# Patient Record
Sex: Female | Born: 1960 | Race: White | Hispanic: No | Marital: Married | Smoking: Current every day smoker
Health system: Southern US, Community
[De-identification: ages and names within clinical notes are randomized; demographics above are authoritative.]

## PROBLEM LIST (undated history)

## (undated) DIAGNOSIS — K649 Unspecified hemorrhoids: Secondary | ICD-10-CM

## (undated) DIAGNOSIS — R911 Solitary pulmonary nodule: Secondary | ICD-10-CM

## (undated) DIAGNOSIS — D689 Coagulation defect, unspecified: Secondary | ICD-10-CM

## (undated) DIAGNOSIS — T8859XA Other complications of anesthesia, initial encounter: Secondary | ICD-10-CM

## (undated) DIAGNOSIS — Q211 Atrial septal defect: Secondary | ICD-10-CM

## (undated) DIAGNOSIS — T4145XA Adverse effect of unspecified anesthetic, initial encounter: Secondary | ICD-10-CM

## (undated) DIAGNOSIS — R05 Cough: Secondary | ICD-10-CM

## (undated) DIAGNOSIS — R059 Cough, unspecified: Secondary | ICD-10-CM

## (undated) DIAGNOSIS — I82409 Acute embolism and thrombosis of unspecified deep veins of unspecified lower extremity: Secondary | ICD-10-CM

## (undated) DIAGNOSIS — I729 Aneurysm of unspecified site: Secondary | ICD-10-CM

## (undated) DIAGNOSIS — I253 Aneurysm of heart: Secondary | ICD-10-CM

## (undated) DIAGNOSIS — K219 Gastro-esophageal reflux disease without esophagitis: Secondary | ICD-10-CM

## (undated) DIAGNOSIS — I10 Essential (primary) hypertension: Secondary | ICD-10-CM

## (undated) DIAGNOSIS — Q2112 Patent foramen ovale: Secondary | ICD-10-CM

## (undated) DIAGNOSIS — D649 Anemia, unspecified: Secondary | ICD-10-CM

## (undated) DIAGNOSIS — F419 Anxiety disorder, unspecified: Secondary | ICD-10-CM

## (undated) DIAGNOSIS — I739 Peripheral vascular disease, unspecified: Secondary | ICD-10-CM

## (undated) HISTORY — DX: Peripheral vascular disease, unspecified: I73.9

## (undated) HISTORY — DX: Aneurysm of unspecified site: I72.9

## (undated) HISTORY — DX: Anxiety disorder, unspecified: F41.9

## (undated) HISTORY — DX: Gastro-esophageal reflux disease without esophagitis: K21.9

## (undated) HISTORY — DX: Acute embolism and thrombosis of unspecified deep veins of unspecified lower extremity: I82.409

## (undated) HISTORY — PX: WISDOM TOOTH EXTRACTION: SHX21

## (undated) HISTORY — PX: TONSILLECTOMY: SUR1361

## (undated) HISTORY — DX: Essential (primary) hypertension: I10

## (undated) HISTORY — PX: BREAST SURGERY: SHX581

## (undated) HISTORY — PX: COSMETIC SURGERY: SHX468

## (undated) HISTORY — DX: Anemia, unspecified: D64.9

## (undated) HISTORY — DX: Unspecified hemorrhoids: K64.9

## (undated) HISTORY — PX: OTHER SURGICAL HISTORY: SHX169

## (undated) HISTORY — DX: Coagulation defect, unspecified: D68.9

---
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
## 2004-07-01 HISTORY — PX: COLONOSCOPY: SHX174

## 2013-02-02 ENCOUNTER — Ambulatory Visit: Payer: Self-pay | Admitting: Obstetrics

## 2013-02-10 ENCOUNTER — Encounter: Payer: Self-pay | Admitting: Obstetrics

## 2013-02-10 ENCOUNTER — Ambulatory Visit (INDEPENDENT_AMBULATORY_CARE_PROVIDER_SITE_OTHER): Payer: BC Managed Care – PPO | Admitting: Obstetrics

## 2013-02-10 VITALS — BP 151/97 | HR 90 | Temp 98.9°F | Ht 60.0 in | Wt 137.2 lb

## 2013-02-10 DIAGNOSIS — Z0000 Encounter for general adult medical examination without abnormal findings: Secondary | ICD-10-CM

## 2013-02-10 DIAGNOSIS — Z113 Encounter for screening for infections with a predominantly sexual mode of transmission: Secondary | ICD-10-CM

## 2013-02-10 DIAGNOSIS — F329 Major depressive disorder, single episode, unspecified: Secondary | ICD-10-CM

## 2013-02-10 DIAGNOSIS — Z01419 Encounter for gynecological examination (general) (routine) without abnormal findings: Secondary | ICD-10-CM

## 2013-02-10 DIAGNOSIS — F3289 Other specified depressive episodes: Secondary | ICD-10-CM

## 2013-02-10 DIAGNOSIS — Z1239 Encounter for other screening for malignant neoplasm of breast: Secondary | ICD-10-CM

## 2013-02-10 DIAGNOSIS — I10 Essential (primary) hypertension: Secondary | ICD-10-CM | POA: Insufficient documentation

## 2013-02-10 MED ORDER — ESCITALOPRAM OXALATE 10 MG PO TABS: 10.0000 mg | ORAL_TABLET | Freq: Every day | ORAL | Status: DC

## 2013-02-10 MED ORDER — LISINOPRIL-HYDROCHLOROTHIAZIDE 10-12.5 MG PO TABS: 1.0000 | ORAL_TABLET | Freq: Every day | ORAL | Status: DC

## 2013-02-10 NOTE — Progress Notes (Signed)
.   Subjective:     Patricia [name] is a 52 y.o. female here for a routine exam.  No current complaints.  Personal health questionnaire reviewed: yes.   Gynecologic History No LMP recorded. Patient is postmenopausal. Contraception: none Last Pap: five years ago. Results were: normal Last mammogram: six years ago. Results were: normal  Obstetric History OB History  No data available     The following portions of the patient's history were reviewed and updated as appropriate: allergies, current medications, past family history, past medical history, past social history, past surgical history and problem list.  Review of Systems Pertinent items are noted in HPI.    Objective:    General appearance: alert and no distress Breasts: normal appearance, no masses or tenderness Abdomen: normal findings: soft, non-tender Pelvic: cervix normal in appearance, external genitalia normal, no adnexal masses or tenderness, no cervical motion tenderness, uterus normal size, shape, and consistency and vagina normal without discharge Extremities: extremities normal, atraumatic, no cyanosis or edema    Assessment:    Healthy female exam.  Depression HTN H/O DVT H/O Cardiac Dz   Plan:    Mammogram ordered. Follow up in: 1 year. Lexapro Rx Prinivil Rx Referred to PMD for general health maintenance.

## 2013-02-11 ENCOUNTER — Telehealth: Payer: Self-pay | Admitting: *Deleted

## 2013-02-11 ENCOUNTER — Other Ambulatory Visit: Payer: Self-pay | Admitting: *Deleted

## 2013-02-11 ENCOUNTER — Encounter: Payer: Self-pay | Admitting: Obstetrics

## 2013-02-11 DIAGNOSIS — N760 Acute vaginitis: Secondary | ICD-10-CM

## 2013-02-11 DIAGNOSIS — B9689 Other specified bacterial agents as the cause of diseases classified elsewhere: Secondary | ICD-10-CM

## 2013-02-11 DIAGNOSIS — I10 Essential (primary) hypertension: Secondary | ICD-10-CM

## 2013-02-11 LAB — PAP IG W/ RFLX HPV ASCU

## 2013-02-11 MED ORDER — LISINOPRIL-HYDROCHLOROTHIAZIDE 10-12.5 MG PO TABS: 1.0000 | ORAL_TABLET | Freq: Every day | ORAL | Status: DC

## 2013-02-11 MED ORDER — METRONIDAZOLE 500 MG PO TABS: 500.0000 mg | ORAL_TABLET | Freq: Two times a day (BID) | ORAL | Status: DC

## 2013-02-11 NOTE — Telephone Encounter (Signed)
Per Michele Mcalpine at [facility] both prescriptions (Flagyl and Zestoretic) have been received and are being worked on. Per Dr. Frances Maywood not write prescription for sleeping medication, pt will need to follow up with PCP for that prescription. Pt expressed understanding. Per Dr. Clearance Coots- okay to change Zestoretic 10-12.5 mg to 10-25 mg and from quantity #1 to #30 with 11 refills.

## 2013-02-11 NOTE — Progress Notes (Signed)
Pt aware prescription sent to pharmacy °

## 2013-02-11 NOTE — Telephone Encounter (Signed)
Per Patricia [name] with [facility], the previous statement made by his Tech stating Zestoretic available in 10-25 mg is incorrect and will fill original prescription of 10-12.5 mg #30 with 11 refills.

## 2013-02-17 ENCOUNTER — Ambulatory Visit (HOSPITAL_COMMUNITY)
Admission: RE | Admit: 2013-02-17 | Discharge: 2013-02-17 | Disposition: A | Discharge status: Home / Self Care | Payer: BC Managed Care – PPO | Source: Ambulatory Visit | Attending: Obstetrics | Admitting: Obstetrics

## 2013-02-17 DIAGNOSIS — Z1231 Encounter for screening mammogram for malignant neoplasm of breast: Secondary | ICD-10-CM | POA: Insufficient documentation

## 2013-02-17 DIAGNOSIS — Z1239 Encounter for other screening for malignant neoplasm of breast: Secondary | ICD-10-CM

## 2013-03-09 ENCOUNTER — Encounter: Payer: Self-pay | Admitting: Gastroenterology

## 2013-03-10 ENCOUNTER — Encounter (INDEPENDENT_AMBULATORY_CARE_PROVIDER_SITE_OTHER): Payer: BC Managed Care – PPO | Admitting: Vascular Surgery

## 2013-03-10 ENCOUNTER — Encounter: Payer: Self-pay | Admitting: Internal Medicine

## 2013-03-10 DIAGNOSIS — M79609 Pain in unspecified limb: Secondary | ICD-10-CM

## 2013-03-10 DIAGNOSIS — I739 Peripheral vascular disease, unspecified: Secondary | ICD-10-CM

## 2013-03-22 ENCOUNTER — Ambulatory Visit (AMBULATORY_SURGERY_CENTER): Payer: Self-pay | Admitting: *Deleted

## 2013-03-22 VITALS — Ht 60.0 in | Wt 136.6 lb

## 2013-03-22 DIAGNOSIS — Z86010 Personal history of colonic polyps: Secondary | ICD-10-CM

## 2013-03-22 MED ORDER — MOVIPREP 100 G PO SOLR: 1.0000 | Freq: Once | ORAL | Status: DC

## 2013-03-22 NOTE — Progress Notes (Signed)
No egg or soy allergy. No anesthesia problems.  Pt states she had colonoscopy in 2006 which was her 4th colonoscopy, 1st colon pt states she had 19 polyps, and had to go back for 1 year follow-up, 2nd colon pt states she had 3 polyps and had to go back for 1 year follow-up, 3rd colon was normal per pt, 4th was normal as well per pt, pt could not remember Dr.'s full name only that it was Dr. Yetta Flock in [place], [place], tried to look up dr while pt was here but she could not identify Dr.   Rock Nephew signed release of information formed and filled out, just waiting on pt to call back with Dr. Rebecca Eaton name and information.-adm

## 2013-03-23 ENCOUNTER — Encounter: Payer: Self-pay | Admitting: Gastroenterology

## 2013-03-24 ENCOUNTER — Telehealth: Payer: Self-pay | Admitting: Gastroenterology

## 2013-03-24 NOTE — Telephone Encounter (Signed)
Release sent

## 2013-03-24 NOTE — Telephone Encounter (Signed)
Pt is calling to give the information of her previous Gi in [place]. His name is Dr. Lacretia Nicks. Francie Massing he is with [facility]. She says his address used to be [address] [address] and phone # [phone]. Per pt address and phone might be different now.

## 2013-03-24 NOTE — Telephone Encounter (Signed)
Pt states she had colonoscopy in 2006 which was her 4th colonoscopy, 1st colon pt states she had 19 polyps, and had to go back for 1 year follow-up, 2nd colon pt states she had 3 polyps and had to go back for 1 year follow-up, 3rd colon was normal per pt, 4th was normal as well per pt, pt could not remember Dr.'s full name only that it was Dr. Yetta Flock in [place], [place], tried to look up dr while pt was here but she could not identify Dr.  Rock Nephew signed release of information formed and filled out, just waiting on pt to call back with Dr. Rebecca Eaton name and information.-adm      03/24/13:  Pt is scheduled for colonoscopy 10/6/ Monday with Dr. Christella Hartigan.  Release of information form given to Chales Abrahams, CMA.

## 2013-04-05 ENCOUNTER — Encounter: Payer: Self-pay | Admitting: Gastroenterology

## 2013-04-05 ENCOUNTER — Ambulatory Visit (AMBULATORY_SURGERY_CENTER): Payer: BC Managed Care – PPO | Admitting: Gastroenterology

## 2013-04-05 VITALS — BP 90/52 | HR 57 | Temp 97.3°F | Resp 15 | Ht 60.0 in | Wt 136.0 lb

## 2013-04-05 DIAGNOSIS — Z86010 Personal history of colonic polyps: Secondary | ICD-10-CM

## 2013-04-05 DIAGNOSIS — D126 Benign neoplasm of colon, unspecified: Secondary | ICD-10-CM

## 2013-04-05 DIAGNOSIS — K644 Residual hemorrhoidal skin tags: Secondary | ICD-10-CM

## 2013-04-05 MED ORDER — SODIUM CHLORIDE 0.9 % IV SOLN: 500.0000 mL | INTRAVENOUS | Status: DC

## 2013-04-05 NOTE — Op Note (Signed)
[facility] [address] [place], [address]   COLONOSCOPY PROCEDURE REPORT  PATIENT: Patricia [name], Patricia [name]  MR#: [id number] BIRTHDATE: 07-07-1960 , 52  yrs. old GENDER: Female ENDOSCOPIST: Rachael Fee, MD REFERRED NW:GNFAO Link Snuffer, M.D. PROCEDURE DATE:  04/05/2013 PROCEDURE:   Colonoscopy with snare polypectomy First Screening Colonoscopy - Avg.  risk and is 50 yrs.  old or older - No.  Prior Negative Screening - Now for repeat screening. N/A  History of Adenoma - Now for follow-up colonoscopy & has been > or = to 3 yrs.  Yes hx of adenoma.  Has been 3 or more years since last colonoscopy.  Polyps Removed Today? Yes. ASA CLASS:   Class II INDICATIONS:Pt states she had colonoscopy in 2006 which was her 4th colonoscopy, 1st colon pt states she had 19 polyps, and had to go back for 1 year follow-up, 2nd colon pt states she had 3 polyps and had to go back for 1 year follow-up, 3rd colon was normal per pt, 4th was normal as well per pt, pt could not remember Dr.'s full name only that it was Dr.  Yetta Flock in [place], [place], tried to look up dr while pt was here but she could not identify Dr. MEDICATIONS: Fentanyl 75 mcg IV, Versed 7 mg IV, and These medications were titrated to patient response per physician's verbal order  DESCRIPTION OF PROCEDURE:   After the risks benefits and alternatives of the procedure were thoroughly explained, informed consent was obtained.  A digital rectal exam revealed no abnormalities of the rectum.   The LB [id number]  endoscope was introduced through the anus and advanced to the cecum, which was identified by both the appendix and ileocecal valve. No adverse events experienced.   The quality of the prep was good, using MoviPrep  The instrument was then slowly withdrawn as the colon was fully examined.  COLON FINDINGS: There were medium sized external hemorrhoids.  There were two polyps that were found, removed and sent to  pathology. These were both sessile, 2-10mm across, appeared hyperplastic, located in descending and sigmoid segmeent, removed with cold snare, sent to pathology.  The examination was otherwise normal. Retroflexed views revealed no abnormalities. The time to cecum=2 minutes 52 seconds.  Withdrawal time=8 minutes 27 seconds.  The scope was withdrawn and the procedure completed. COMPLICATIONS: There were no complications.  ENDOSCOPIC IMPRESSION: There were medium sized external hemorrhoids. There were two polyps that were found, removed and sent to pathology. The examination was otherwise normal.  RECOMMENDATIONS: If the polyp(s) removed today are proven to be adenomatous (pre-cancerous) polyps, you will need a repeat colonoscopy in 5 years.  My office will continue to try to get your previous colonoscopy reports, pathology reports from [place].  You will receive a letter within 1-2 weeks with the results of your biopsy as well as final recommendations.  Please call my office if you have not received a letter after 3 weeks.   eSigned:  Rachael Fee, MD 04/05/2013 8:24 AM

## 2013-04-05 NOTE — Progress Notes (Signed)
Patient did not experience any of the following events: a burn prior to discharge; a fall within the facility; wrong site/side/patient/procedure/implant event; or a hospital transfer or hospital admission upon discharge from the facility. (G8907) Patient did not have preoperative order for IV antibiotic SSI prophylaxis. (G8918)  

## 2013-04-05 NOTE — Patient Instructions (Addendum)
Discharge instructions given with verbal understanding. Handouts on polyps and hemorrhoids. Resume previous medications. YOU HAD AN ENDOSCOPIC PROCEDURE TODAY AT THE [facility]: Refer to the procedure report that was given to you for any specific questions about what was found during the examination.  If the procedure report does not answer your questions, please call your gastroenterologist to clarify.  If you requested that your care partner not be given the details of your procedure findings, then the procedure report has been included in a sealed envelope for you to review at your convenience later.  YOU SHOULD EXPECT: Some feelings of bloating in the abdomen. Passage of more gas than usual.  Walking can help get rid of the air that was put into your GI tract during the procedure and reduce the bloating. If you had a lower endoscopy (such as a colonoscopy or flexible sigmoidoscopy) you may notice spotting of blood in your stool or on the toilet paper. If you underwent a bowel prep for your procedure, then you may not have a normal bowel movement for a few days.  DIET: Your first meal following the procedure should be a light meal and then it is ok to progress to your normal diet.  A half-sandwich or bowl of soup is an example of a good first meal.  Heavy or fried foods are harder to digest and may make you feel nauseous or bloated.  Likewise meals heavy in dairy and vegetables can cause extra gas to form and this can also increase the bloating.  Drink plenty of fluids but you should avoid alcoholic beverages for 24 hours.  ACTIVITY: Your care partner should take you home directly after the procedure.  You should plan to take it easy, moving slowly for the rest of the day.  You can resume normal activity the day after the procedure however you should NOT DRIVE or use heavy machinery for 24 hours (because of the sedation medicines used during the test).    SYMPTOMS TO REPORT  IMMEDIATELY: A gastroenterologist can be reached at any hour.  During normal business hours, 8:30 AM to 5:00 PM Monday through Friday, call [phone].  After hours and on weekends, please call the GI answering service at [phone] who will take a message and have the physician on call contact you.   Following lower endoscopy (colonoscopy or flexible sigmoidoscopy):  Excessive amounts of blood in the stool  Significant tenderness or worsening of abdominal pains  Swelling of the abdomen that is new, acute  Fever of 100F or higher  FOLLOW UP: If any biopsies were taken you will be contacted by phone or by letter within the next 1-3 weeks.  Call your gastroenterologist if you have not heard about the biopsies in 3 weeks.  Our staff will call the home number listed on your records the next business day following your procedure to check on you and address any questions or concerns that you may have at that time regarding the information given to you following your procedure. This is a courtesy call and so if there is no answer at the home number and we have not heard from you through the emergency physician on call, we will assume that you have returned to your regular daily activities without incident.  SIGNATURES/CONFIDENTIALITY: You and/or your care partner have signed paperwork which will be entered into your electronic medical record.  These signatures attest to the fact that that the information above on your After Visit Summary   has been reviewed and is understood.  Full responsibility of the confidentiality of this discharge information lies with you and/or your care-partner. 

## 2013-04-06 ENCOUNTER — Telehealth: Payer: Self-pay | Admitting: *Deleted

## 2013-04-06 NOTE — Telephone Encounter (Signed)
  Follow up Call-  Call back number 04/05/2013  Post procedure Call Back phone  # [phone]  Permission to leave phone message Yes    Pt's voicemail full; unable to leave a message

## 2013-04-09 ENCOUNTER — Encounter: Payer: Self-pay | Admitting: Gastroenterology

## 2014-03-01 ENCOUNTER — Telehealth: Payer: Self-pay | Admitting: *Deleted

## 2014-03-01 NOTE — Telephone Encounter (Signed)
Fax from pharmacy for refill on pt Escitalopram 10mg .  Please advise

## 2014-03-01 NOTE — Telephone Encounter (Signed)
Fax from pharmacy for refill on Lisinopril-HCTZ 10-12.5 mg tab   Please advise

## 2014-04-15 ENCOUNTER — Other Ambulatory Visit: Payer: Self-pay | Admitting: Obstetrics

## 2014-08-25 ENCOUNTER — Other Ambulatory Visit: Payer: Self-pay

## 2014-08-25 ENCOUNTER — Encounter (HOSPITAL_COMMUNITY): Payer: Self-pay

## 2014-08-25 ENCOUNTER — Emergency Department (HOSPITAL_COMMUNITY)
Admission: EM | Admit: 2014-08-25 | Discharge: 2014-08-26 | Disposition: A | Discharge status: Other Acute Inpatient Hospital | Payer: BLUE CROSS/BLUE SHIELD | Attending: Emergency Medicine | Admitting: Emergency Medicine

## 2014-08-25 DIAGNOSIS — I10 Essential (primary) hypertension: Secondary | ICD-10-CM | POA: Insufficient documentation

## 2014-08-25 DIAGNOSIS — Z720 Tobacco use: Secondary | ICD-10-CM | POA: Insufficient documentation

## 2014-08-25 DIAGNOSIS — T50902A Poisoning by unspecified drugs, medicaments and biological substances, intentional self-harm, initial encounter: Secondary | ICD-10-CM

## 2014-08-25 DIAGNOSIS — Z86718 Personal history of other venous thrombosis and embolism: Secondary | ICD-10-CM | POA: Insufficient documentation

## 2014-08-25 DIAGNOSIS — Z79899 Other long term (current) drug therapy: Secondary | ICD-10-CM | POA: Insufficient documentation

## 2014-08-25 DIAGNOSIS — I729 Aneurysm of unspecified site: Secondary | ICD-10-CM | POA: Insufficient documentation

## 2014-08-25 DIAGNOSIS — T424X2A Poisoning by benzodiazepines, intentional self-harm, initial encounter: Secondary | ICD-10-CM | POA: Insufficient documentation

## 2014-08-25 DIAGNOSIS — Z7982 Long term (current) use of aspirin: Secondary | ICD-10-CM | POA: Insufficient documentation

## 2014-08-25 DIAGNOSIS — F10230 Alcohol dependence with withdrawal, uncomplicated: Secondary | ICD-10-CM | POA: Diagnosis present

## 2014-08-25 DIAGNOSIS — F1210 Cannabis abuse, uncomplicated: Secondary | ICD-10-CM | POA: Insufficient documentation

## 2014-08-25 DIAGNOSIS — F322 Major depressive disorder, single episode, severe without psychotic features: Secondary | ICD-10-CM | POA: Diagnosis present

## 2014-08-25 DIAGNOSIS — T424X1A Poisoning by benzodiazepines, accidental (unintentional), initial encounter: Secondary | ICD-10-CM | POA: Diagnosis present

## 2014-08-25 DIAGNOSIS — F419 Anxiety disorder, unspecified: Secondary | ICD-10-CM | POA: Diagnosis present

## 2014-08-25 DIAGNOSIS — F1020 Alcohol dependence, uncomplicated: Secondary | ICD-10-CM | POA: Diagnosis present

## 2014-08-25 DIAGNOSIS — R45851 Suicidal ideations: Secondary | ICD-10-CM

## 2014-08-25 DIAGNOSIS — F1010 Alcohol abuse, uncomplicated: Secondary | ICD-10-CM

## 2014-08-25 DIAGNOSIS — T1491XA Suicide attempt, initial encounter: Secondary | ICD-10-CM | POA: Diagnosis present

## 2014-08-25 LAB — COMPREHENSIVE METABOLIC PANEL
ALK PHOS: 97 U/L (ref 39–117)
ALT: 75 U/L — AB (ref 0–35)
AST: 49 U/L — ABNORMAL HIGH (ref 0–37)
Albumin: 4.5 g/dL (ref 3.5–5.2)
Anion gap: 14 (ref 5–15)
BILIRUBIN TOTAL: 0.3 mg/dL (ref 0.3–1.2)
BUN: 11 mg/dL (ref 6–23)
CHLORIDE: 104 mmol/L (ref 96–112)
CO2: 18 mmol/L — ABNORMAL LOW (ref 19–32)
Calcium: 9.6 mg/dL (ref 8.4–10.5)
Creatinine, Ser: 0.6 mg/dL (ref 0.50–1.10)
GFR calc non Af Amer: >90 mL/min (ref >90)
GLUCOSE: 136 mg/dL — AB (ref 70–99)
POTASSIUM: 3.2 mmol/L — AB (ref 3.5–5.1)
SODIUM: 136 mmol/L (ref 135–145)
TOTAL PROTEIN: 7.7 g/dL (ref 6.0–8.3)

## 2014-08-25 LAB — CBC
HCT: 40.7 % (ref 36.0–46.0)
Hemoglobin: 14.3 g/dL (ref 12.0–15.0)
MCH: 32.5 pg (ref 26.0–34.0)
MCHC: 35.1 g/dL (ref 30.0–36.0)
MCV: 92.5 fL (ref 78.0–100.0)
Platelets: 241 10*3/uL (ref 150–400)
RBC: 4.4 MIL/uL (ref 3.87–5.11)
RDW: 13.4 % (ref 11.5–15.5)
WBC: 10.4 10*3/uL (ref 4.0–10.5)

## 2014-08-25 LAB — ETHANOL: Alcohol, Ethyl (B): 187 mg/dL — ABNORMAL HIGH (ref 0–9)

## 2014-08-25 LAB — CBG MONITORING, ED: Glucose-Capillary: 105 mg/dL — ABNORMAL HIGH (ref 70–99)

## 2014-08-25 LAB — ACETAMINOPHEN LEVEL: Acetaminophen (Tylenol), Serum: <10 ug/mL — ABNORMAL LOW (ref 10–30)

## 2014-08-25 LAB — SALICYLATE LEVEL: Salicylate Lvl: <4 mg/dL (ref 2.8–20.0)

## 2014-08-25 NOTE — ED Notes (Signed)
Patient arrives tearful and intoxicated by EMS-took Ativan 15 mg and drank 5 double white russians at 2145 in an attempt to "kill myself".  Situational problems with significant other.

## 2014-08-25 NOTE — ED Notes (Signed)
Bed: RESB Expected date:  Expected time:  Means of arrival:  Comments: EMS overdose
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
## 2014-08-25 NOTE — ED Notes (Addendum)
Spoke with Langley Gauss from [facility] to monitor symptoms/no other treatment at this time

## 2014-08-26 ENCOUNTER — Encounter (HOSPITAL_COMMUNITY): Payer: Self-pay | Admitting: *Deleted

## 2014-08-26 ENCOUNTER — Inpatient Hospital Stay (HOSPITAL_COMMUNITY)
Admission: AD | Admit: 2014-08-26 | Discharge: 2014-08-31 | DRG: 885 | Disposition: A | Discharge status: Home / Self Care | Payer: BLUE CROSS/BLUE SHIELD | Source: Intra-hospital | Attending: Psychiatry | Admitting: Psychiatry

## 2014-08-26 DIAGNOSIS — T1491XA Suicide attempt, initial encounter: Secondary | ICD-10-CM | POA: Diagnosis present

## 2014-08-26 DIAGNOSIS — Z7982 Long term (current) use of aspirin: Secondary | ICD-10-CM

## 2014-08-26 DIAGNOSIS — I10 Essential (primary) hypertension: Secondary | ICD-10-CM

## 2014-08-26 DIAGNOSIS — T424X1A Poisoning by benzodiazepines, accidental (unintentional), initial encounter: Secondary | ICD-10-CM | POA: Diagnosis present

## 2014-08-26 DIAGNOSIS — F1020 Alcohol dependence, uncomplicated: Secondary | ICD-10-CM | POA: Diagnosis present

## 2014-08-26 DIAGNOSIS — F419 Anxiety disorder, unspecified: Secondary | ICD-10-CM | POA: Diagnosis present

## 2014-08-26 DIAGNOSIS — F322 Major depressive disorder, single episode, severe without psychotic features: Secondary | ICD-10-CM | POA: Diagnosis present

## 2014-08-26 DIAGNOSIS — T1491 Suicide attempt: Secondary | ICD-10-CM

## 2014-08-26 DIAGNOSIS — G4700 Insomnia, unspecified: Secondary | ICD-10-CM | POA: Diagnosis present

## 2014-08-26 DIAGNOSIS — R45851 Suicidal ideations: Secondary | ICD-10-CM | POA: Diagnosis not present

## 2014-08-26 DIAGNOSIS — F10230 Alcohol dependence with withdrawal, uncomplicated: Secondary | ICD-10-CM | POA: Diagnosis present

## 2014-08-26 DIAGNOSIS — Z800 Family history of malignant neoplasm of digestive organs: Secondary | ICD-10-CM | POA: Diagnosis not present

## 2014-08-26 DIAGNOSIS — T424X2A Poisoning by benzodiazepines, intentional self-harm, initial encounter: Secondary | ICD-10-CM

## 2014-08-26 DIAGNOSIS — F17210 Nicotine dependence, cigarettes, uncomplicated: Secondary | ICD-10-CM | POA: Diagnosis present

## 2014-08-26 LAB — RAPID URINE DRUG SCREEN, HOSP PERFORMED
Amphetamines: NOT DETECTED
Barbiturates: NOT DETECTED
Benzodiazepines: POSITIVE — AB
Cocaine: NOT DETECTED
Opiates: NOT DETECTED
Tetrahydrocannabinol: POSITIVE — AB

## 2014-08-26 MED ORDER — LORAZEPAM 1 MG PO TABS: 0.0000 mg | ORAL_TABLET | Freq: Two times a day (BID) | ORAL | Status: DC

## 2014-08-26 MED ORDER — ASPIRIN 325 MG PO TABS
325.0000 mg | ORAL_TABLET | Freq: Every day | ORAL | Status: DC
  Administered 2014-08-27 – 2014-08-31 (×5): 325 mg via ORAL
  Filled 2014-08-26 (×6): qty 1

## 2014-08-26 MED ORDER — ACETAMINOPHEN 325 MG PO TABS
650.0000 mg | ORAL_TABLET | Freq: Four times a day (QID) | ORAL | Status: DC | PRN
Start: 2014-08-26 — End: 2014-08-31

## 2014-08-26 MED ORDER — ALUM & MAG HYDROXIDE-SIMETH 200-200-20 MG/5ML PO SUSP: 30.0000 mL | ORAL | Status: DC | PRN

## 2014-08-26 MED ORDER — THIAMINE HCL 100 MG/ML IJ SOLN: 100.0000 mg | Freq: Every day | INTRAMUSCULAR | Status: DC

## 2014-08-26 MED ORDER — LORAZEPAM 1 MG PO TABS
0.0000 mg | ORAL_TABLET | Freq: Four times a day (QID) | ORAL | Status: DC
  Administered 2014-08-26: 1 mg via ORAL
  Filled 2014-08-26: qty 1

## 2014-08-26 MED ORDER — ESCITALOPRAM OXALATE 20 MG PO TABS
20.0000 mg | ORAL_TABLET | Freq: Every day | ORAL | Status: DC
  Administered 2014-08-27 – 2014-08-31 (×5): 20 mg via ORAL
  Filled 2014-08-26 (×2): qty 1
  Filled 2014-08-26: qty 3
  Filled 2014-08-26 (×4): qty 1

## 2014-08-26 MED ORDER — INFLUENZA VAC SPLIT QUAD 0.5 ML IM SUSY
0.5000 mL | PREFILLED_SYRINGE | INTRAMUSCULAR | Status: AC
  Administered 2014-08-28: 0.5 mL via INTRAMUSCULAR
  Filled 2014-08-26: qty 0.5

## 2014-08-26 MED ORDER — POTASSIUM CHLORIDE CRYS ER 10 MEQ PO TBCR
10.0000 meq | EXTENDED_RELEASE_TABLET | Freq: Every day | ORAL | Status: DC
  Administered 2014-08-26: 10 meq via ORAL
  Filled 2014-08-26: qty 1

## 2014-08-26 MED ORDER — LISINOPRIL 10 MG PO TABS
10.0000 mg | ORAL_TABLET | Freq: Every day | ORAL | Status: DC
  Administered 2014-08-26: 10 mg via ORAL
  Filled 2014-08-26 (×2): qty 1

## 2014-08-26 MED ORDER — POTASSIUM CHLORIDE CRYS ER 10 MEQ PO TBCR
10.0000 meq | EXTENDED_RELEASE_TABLET | Freq: Every day | ORAL | Status: DC
  Administered 2014-08-27 – 2014-08-31 (×5): 10 meq via ORAL
  Filled 2014-08-26 (×6): qty 1

## 2014-08-26 MED ORDER — LISINOPRIL 10 MG PO TABS
10.0000 mg | ORAL_TABLET | Freq: Every day | ORAL | Status: DC
  Administered 2014-08-27 – 2014-08-31 (×5): 10 mg via ORAL
  Filled 2014-08-26 (×6): qty 1

## 2014-08-26 MED ORDER — VITAMIN B-1 100 MG PO TABS
100.0000 mg | ORAL_TABLET | Freq: Every day | ORAL | Status: DC
  Administered 2014-08-26: 100 mg via ORAL
  Filled 2014-08-26: qty 1

## 2014-08-26 MED ORDER — HYDROCHLOROTHIAZIDE 12.5 MG PO CAPS
12.5000 mg | ORAL_CAPSULE | Freq: Every day | ORAL | Status: DC
  Administered 2014-08-27 – 2014-08-31 (×5): 12.5 mg via ORAL
  Filled 2014-08-26 (×6): qty 1

## 2014-08-26 MED ORDER — MAGNESIUM HYDROXIDE 400 MG/5ML PO SUSP: 30.0000 mL | Freq: Every day | ORAL | Status: DC | PRN

## 2014-08-26 MED ORDER — LISINOPRIL-HYDROCHLOROTHIAZIDE 10-12.5 MG PO TABS: 1.0000 | ORAL_TABLET | Freq: Every day | ORAL | Status: DC

## 2014-08-26 MED ORDER — HYDROCHLOROTHIAZIDE 12.5 MG PO CAPS
12.5000 mg | ORAL_CAPSULE | Freq: Every day | ORAL | Status: DC
  Administered 2014-08-26: 12.5 mg via ORAL
  Filled 2014-08-26: qty 1

## 2014-08-26 MED ORDER — NICOTINE 21 MG/24HR TD PT24
21.0000 mg | MEDICATED_PATCH | Freq: Once | TRANSDERMAL | Status: DC
  Administered 2014-08-26: 21 mg via TRANSDERMAL
  Filled 2014-08-26: qty 1

## 2014-08-26 MED ORDER — LORAZEPAM 1 MG PO TABS
0.0000 mg | ORAL_TABLET | Freq: Two times a day (BID) | ORAL | Status: DC
  Administered 2014-08-27: 1 mg via ORAL
  Filled 2014-08-26: qty 1

## 2014-08-26 MED ORDER — ESCITALOPRAM OXALATE 10 MG PO TABS
20.0000 mg | ORAL_TABLET | Freq: Every day | ORAL | Status: DC
  Administered 2014-08-26: 20 mg via ORAL
  Filled 2014-08-26: qty 2

## 2014-08-26 MED ORDER — ASPIRIN 325 MG PO TABS
325.0000 mg | ORAL_TABLET | Freq: Every day | ORAL | Status: DC
  Administered 2014-08-26: 325 mg via ORAL
  Filled 2014-08-26: qty 1

## 2014-08-26 MED ORDER — TRAZODONE HCL 50 MG PO TABS
50.0000 mg | ORAL_TABLET | Freq: Every evening | ORAL | Status: DC | PRN
  Administered 2014-08-26: 50 mg via ORAL
  Filled 2014-08-26: qty 1

## 2014-08-26 MED ORDER — NICOTINE 21 MG/24HR TD PT24
21.0000 mg | MEDICATED_PATCH | Freq: Every day | TRANSDERMAL | Status: DC
  Administered 2014-08-27 – 2014-08-31 (×5): 21 mg via TRANSDERMAL
  Filled 2014-08-26 (×6): qty 1

## 2014-08-26 MED ORDER — NICOTINE 21 MG/24HR TD PT24
21.0000 mg | MEDICATED_PATCH | Freq: Every day | TRANSDERMAL | Status: DC
  Administered 2014-08-26: 21 mg via TRANSDERMAL

## 2014-08-26 MED ORDER — VITAMIN B-1 100 MG PO TABS
100.0000 mg | ORAL_TABLET | Freq: Every day | ORAL | Status: DC
  Administered 2014-08-27: 100 mg via ORAL
  Filled 2014-08-26 (×2): qty 1

## 2014-08-26 MED ORDER — PNEUMOCOCCAL VAC POLYVALENT 25 MCG/0.5ML IJ INJ
0.5000 mL | INJECTION | INTRAMUSCULAR | Status: AC
  Administered 2014-08-28: 0.5 mL via INTRAMUSCULAR

## 2014-08-26 NOTE — ED Notes (Signed)
TTS currently with Patricia [name]

## 2014-08-26 NOTE — Tx Team (Signed)
Initial Interdisciplinary Treatment Plan   PATIENT STRESSORS: Financial difficulties Marital or family conflict Substance abuse   PATIENT STRENGTHS: Average or above average intelligence Capable of independent living Motivation for treatment/growth Supportive family/friends   PROBLEM LIST: Problem List/Patient Goals Date to be addressed Date deferred Reason deferred Estimated date of resolution  Polysubstance abuse 08/26/2014     Depression 08/26/2014     Suicidal ideation 08/26/2014     "I need to get rid of my boyfriend.  He is my stressor." 08/26/2014     Relationship conflict 03/70/4888     "I have financial concerns. Unsure of housing upon d/c. 08/16/2014                        DISCHARGE CRITERIA:  Ability to meet basic life and health needs Withdrawal symptoms are absent or subacute and managed without 24-hour nursing intervention  PRELIMINARY DISCHARGE PLAN: Attend 12-step recovery group Outpatient therapy  PATIENT/FAMIILY INVOLVEMENT: This treatment plan has been presented to and reviewed with the patient, Uldine Fuster.  The patient and family have been given the opportunity to ask questions and make suggestions.  Zipporah Plants 08/26/2014, 4:16 PM

## 2014-08-26 NOTE — ED Notes (Signed)
Report to Burna Mortimer RN in [place] for patient

## 2014-08-26 NOTE — ED Notes (Signed)
Poison control called back to check on pt.

## 2014-08-26 NOTE — ED Notes (Signed)
Patient awake and yelling out of room/crying-states she needs to smoke, requesting a patch, ordered obtained and applied

## 2014-08-26 NOTE — BH Assessment (Signed)
[facility] Assessment Progress Note  Per Corena Pilgrim, MD, this pt requires psychiatric hospitalization at this time.  Debarah Crape, RN, [facility] has assigned pt to Rm 300-2.  Pt has signed Voluntary Admission and Consent for Treatment, as well as Consent to Release Information, and signed forms have been faxed to [facility].  Pt's nurse, Randall Hiss, has been notified.  He agrees to send original paperwork along with pt via [facility] Pries, and to call report to [phone].  Jalene Mullet, MA Triage Specialist 08/26/2014 @ 12:57
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
## 2014-08-26 NOTE — ED Notes (Signed)
Patient is calmer
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
## 2014-08-26 NOTE — ED Notes (Signed)
Pt has in belonging bag:  White shirt, blue pants, and black hair tie
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
## 2014-08-26 NOTE — Progress Notes (Signed)
The patient attended this evening's A. A. Meeting and was appropriate. She spoke up on one occasion and talked about her history of substance abuse.

## 2014-08-26 NOTE — ED Notes (Signed)
Spoke with Theola Cuellar from assessment
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
## 2014-08-27 DIAGNOSIS — R45851 Suicidal ideations: Secondary | ICD-10-CM

## 2014-08-27 MED ORDER — LORAZEPAM 1 MG PO TABS
1.0000 mg | ORAL_TABLET | Freq: Four times a day (QID) | ORAL | Status: AC
  Administered 2014-08-27 (×3): 1 mg via ORAL
  Filled 2014-08-27 (×3): qty 1

## 2014-08-27 MED ORDER — THIAMINE HCL 100 MG/ML IJ SOLN: 100.0000 mg | Freq: Once | INTRAMUSCULAR | Status: DC

## 2014-08-27 MED ORDER — LOPERAMIDE HCL 2 MG PO CAPS: 2.0000 mg | ORAL_CAPSULE | ORAL | Status: AC | PRN

## 2014-08-27 MED ORDER — TRAZODONE HCL 100 MG PO TABS
100.0000 mg | ORAL_TABLET | Freq: Every day | ORAL | Status: DC
  Administered 2014-08-27 – 2014-08-30 (×4): 100 mg via ORAL
  Filled 2014-08-27 (×6): qty 1
  Filled 2014-08-27: qty 3
  Filled 2014-08-27: qty 1

## 2014-08-27 MED ORDER — LORAZEPAM 1 MG PO TABS
1.0000 mg | ORAL_TABLET | Freq: Every day | ORAL | Status: AC
  Administered 2014-08-30: 1 mg via ORAL
  Filled 2014-08-27: qty 1

## 2014-08-27 MED ORDER — LORAZEPAM 1 MG PO TABS: 1.0000 mg | ORAL_TABLET | Freq: Four times a day (QID) | ORAL | Status: AC | PRN

## 2014-08-27 MED ORDER — LORAZEPAM 1 MG PO TABS
1.0000 mg | ORAL_TABLET | Freq: Two times a day (BID) | ORAL | Status: AC
  Administered 2014-08-29 (×2): 1 mg via ORAL
  Filled 2014-08-27 (×3): qty 1

## 2014-08-27 MED ORDER — ADULT MULTIVITAMIN W/MINERALS CH
1.0000 | ORAL_TABLET | Freq: Every day | ORAL | Status: DC
Start: 2014-08-27 — End: 2014-08-31
  Administered 2014-08-27 – 2014-08-31 (×5): 1 via ORAL
  Filled 2014-08-27 (×6): qty 1

## 2014-08-27 MED ORDER — ONDANSETRON 4 MG PO TBDP: 4.0000 mg | ORAL_TABLET | Freq: Four times a day (QID) | ORAL | Status: AC | PRN

## 2014-08-27 MED ORDER — VITAMIN B-1 100 MG PO TABS
100.0000 mg | ORAL_TABLET | Freq: Every day | ORAL | Status: DC
  Administered 2014-08-28 – 2014-08-31 (×4): 100 mg via ORAL
  Filled 2014-08-27 (×5): qty 1

## 2014-08-27 MED ORDER — LORAZEPAM 1 MG PO TABS
1.0000 mg | ORAL_TABLET | Freq: Three times a day (TID) | ORAL | Status: AC
  Administered 2014-08-28 (×3): 1 mg via ORAL
  Filled 2014-08-27 (×2): qty 1

## 2014-08-27 MED ORDER — HYDROXYZINE HCL 25 MG PO TABS
25.0000 mg | ORAL_TABLET | Freq: Four times a day (QID) | ORAL | Status: AC | PRN
  Administered 2014-08-28 (×2): 25 mg via ORAL
  Filled 2014-08-27 (×2): qty 1

## 2014-08-27 NOTE — [facility] Suicide Risk Assessment (Signed)
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 Alcohol dependence with uncomplicated withdrawal [W43.154] 08/26/2014  . Overdose of benzodiazepine [T42.4X1A] 08/26/2014  . Suicide attempt [T14.91] 08/26/2014  . Depression, major, single episode, severe [F32.2] 08/26/2014  . Anxiety [F41.9] 08/26/2014  . 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 Suicidal ideation [R45.851]   . Depressive disorder, not elsewhere classified [F32.9] 02/10/2013  . Essential hypertension, benign [I10] 02/10/2013     Continued Clinical Symptoms:  Alcohol Use Disorder Identification Test Final Score (AUDIT): 37 The "Alcohol Use Disorders Identification Test", Guidelines for Use in Primary Care, Second Edition.  [employer] [facility]). Score between 0-7:  no or low risk or alcohol related problems. Score between 8-15:  moderate risk of alcohol related problems. Score between 16-19:  high risk of alcohol related problems. Score 20 or above:  warrants further diagnostic evaluation for alcohol dependence and treatment.   CLINICAL FACTORS:   Depression:   Anhedonia Hopelessness Impulsivity Insomnia Alcohol/Substance Abuse/Dependencies Unstable or Poor Therapeutic Relationship Previous Psychiatric Diagnoses and Treatments   Musculoskeletal: Strength & Muscle Tone: within normal limits Gait & Station: normal Patient leans: N/A  Psychiatric Specialty Exam: Physical Exam  ROS  Blood pressure 144/87, pulse 79, temperature 98.1 F (36.7 C), temperature source Oral, resp. rate 16, height 5' (1.524 m), weight 71.215 kg (157 lb), SpO2 96  %.Body mass index is 30.66 kg/(m^2).  General Appearance: Casual  Eye Contact::  Fair  Speech:  Normal Rate  Volume:  Increased  Mood:  Euphoric and Irritable  Affect:  Labile  Thought Process:  Circumstantial  Orientation:  Full (Time, Place, and Person)  Thought Content:  Hallucinations: Visual and Rumination  Suicidal Thoughts:  Yes.  with intent/plan  Homicidal Thoughts:  No  Memory:  Immediate;   Fair  Judgement:  Impaired  Insight:  Lacking  Psychomotor Activity:  Decreased  Concentration:  Fair  Recall:  [employer] of Knowledge:Fair  Language: Fair  Akathisia:  No  Handed:  Right  AIMS (if indicated):     Assets:  Communication Skills  Sleep:  Number of Hours: 5.5  Cognition: WNL  ADL's:  Intact     COGNITIVE FEATURES THAT CONTRIBUTE TO RISK:  Closed-mindedness, Loss of executive function, Polarized thinking and Thought constriction (tunnel vision)    SUICIDE RISK:   Moderate:  Frequent suicidal ideation with limited intensity, and duration, some specificity in terms of plans, no associated intent, good self-control, limited dysphoria/symptomatology, some risk factors present, and identifiable protective factors, including available and accessible social support.  PLAN OF CARE: Patient is 54 year old Caucasian female who was admitted because of severe depression and having suicidal attempt.  She took overdose on Ativan.  She was also drinking alcohol and her alcohol level was 187.  She was positive for marijuana.  Patient admitted recently she's been more depressed than usual.  She continued to endorse suicidal thoughts, hallucination and requires inpatient psychiatric treatment and stabilization. We will admit the patient and restart medication.  Encouraged to participate in group milieu therapy.  Please see complete admission note for further details.  Medical Decision Making:  New problem, with additional work up planned, Review of Psycho-Social Stressors (1), Review  or order clinical lab tests (1), Decision to obtain old records (1), Review and summation of old records (2), Established Problem, Worsening (2), New Problem, with no additional work-up planned (3), Review of Medication Regimen & Side Effects (2) and Review of New Medication or Change in Dosage (2)  I certify that inpatient services furnished can reasonably be expected to improve the patient's condition.   ARFEEN,SYED T. 08/27/2014, 1:42 PM

## 2014-08-27 NOTE — [facility] Group Notes (Signed)
[facility] Group Notes:  (Clinical Social Work)  08/27/2014     10-11AM  Summary of Progress/Problems:   The main focus of today's process group was to learn how to use a decisional balance exercise to move forward in the Stages of Change, which were described and discussed.  Motivational Interviewing and a worksheet were utilized to help patients explore in depth the perceived benefits and costs of a self-sabotaging behavior, as well as the  benefits and costs of replacing that with a healthy coping mechanism.   The patient expressed indepth understanding of topic.  Type of Therapy:  Group Therapy - Process   Participation Level:  Active  Participation Quality:  Attentive, Sharing and Supportive  Affect:  Appropriate  Cognitive:  Alert and Appropriate  Insight:  Engaged  Engagement in Therapy:  Engaged  Modes of Intervention:  Education, Motivational Interviewing  Selmer Dominion, LCSW 08/27/2014, 12:23 PM

## 2014-08-27 NOTE — Progress Notes (Signed)
[facility] Group Notes:  (Nursing/MHT/Case Management/Adjunct)  Date:  08/27/2014  Time:  11:43 PM  Type of Therapy:  Psychoeducational Skills  Participation Level:  Minimal  Participation Quality:  Attentive  Affect:  Appropriate  Cognitive:  Appropriate  Insight:  Appropriate  Engagement in Group:  Limited  Modes of Intervention:  Education  Summary of Progress/Problems: The patient shared with the group that she had a great overall. The patient mentioned that she attended her groups and was able to socialize more with her peers today. As a theme for the day, her support system will be comprised of her family, church, and Alcoholics Anonymous.   Archie Balboa S 08/27/2014, 11:43 PM

## 2014-08-27 NOTE — Progress Notes (Signed)
D: Pt's mood is depressed but she brightens upon approach. Eye contact is fair. Pt denies SI/HI. Interacting more with her peers A: Support given. Verbalization encouraged. Pt encouraged to come to staff with any concerns. Medications given as prescribed.  R: Pt is receptive. No complaints of pain or discomfort at this time. Q15 min safety checks maintained. Will continue to monitor pt.

## 2014-08-27 NOTE — Progress Notes (Signed)
Pt is very pleasant this am. She c/o swelling to her face but no tongue or neck swelling. Pt stated she has had this since drinking. She rates her depression a 3/10 and her anxiety a 8/20 today. Pt was given 2 tylenol for a headache. She would like to continue with her detox and participate in all groups today.pt does contract for safety and denies SI and HI.Pt has been in the dayroom with the other pts.

## 2014-08-27 NOTE — Progress Notes (Signed)
Adult Psychoeducational Group Note  Date:  08/27/2014 Time:  3:57 PM  Group Topic/Focus:  Psycho Educational  Participation Level:  Active   Participation Quality:  Appropriate and Attentive  Affect:  Appropriate  Cognitive:  Alert and Appropriate  Insight: Appropriate  Engagement in Group:  Engaged and Supportive  Modes of Intervention:  Activity and Discussion  Additional Comments:  Pt. Reported being a [place] as a teenager and be able to avoid the use of alcohol.  However, Pt. Confirmed that she was tempted by negative stimuli as a result of her religion causing her to seek comfort from alcohol.  Pt. Reported wanting to find better comfort in other resources.    Leroy Sea N 08/27/2014, 3:57 PM

## 2014-08-27 NOTE — [facility] Group Notes (Signed)
[facility] Group Notes:  Coping skills  Date:  08/27/2014  Time:  2:51 PM  Type of Therapy:  Nurse Education  Participation Level:  Active  Participation Quality:  Appropriate  Affect:  Appropriate  Cognitive:  Alert  Insight:  Appropriate  Engagement in Group:  Engaged  Modes of Intervention:  Discussion  Summary of Progress/Problems:  Delman Kitten 08/27/2014, 2:51 PM
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
## 2014-08-28 DIAGNOSIS — F322 Major depressive disorder, single episode, severe without psychotic features: Principal | ICD-10-CM

## 2014-08-28 DIAGNOSIS — F1020 Alcohol dependence, uncomplicated: Secondary | ICD-10-CM

## 2014-08-28 NOTE — [facility] Group Notes (Signed)
[facility] Group Notes:  (Nursing/MHT/Case Management/Adjunct)  Date:  08/28/2014  Time:  3:47 PM  Type of Therapy:  Psychoeducational Skills  Participation Level:  Did Not Attend  Participation Quality:  Did Not Attend  Affect:  Did Not Attend  Cognitive:  Did Not Attend  Insight:  None  Engagement in Group:  Did Not Attend  Modes of Intervention:  Did Not Attend  Summary of Progress/Problems: Pt did attend patient self inventory group.   Benancio Deeds Shanta 08/28/2014, 3:47 PM

## 2014-08-28 NOTE — Progress Notes (Signed)
Adult Psychoeducational Group Note  Date:  08/28/2014 Time:  2015  Group Topic/Focus:  AA Meeting  Additional Comments:  Pt attended group.  Lacretia Tindall Chanel 08/28/2014, 10:24 PM

## 2014-08-28 NOTE — Progress Notes (Signed)
D: Pt's mood is pleasant. Eye contact is fair. Pt rates her depression as low. Interacts well with her peers.  A: Support given. Verbalization encouraged. Pt encouraged to come to staff with any concerns. Medications given as prescribed.  R: Pt is receptive. No complaints of pain or discomfort at this time. Q15 min safety checks maintained. Will continue to monitor pt.

## 2014-08-28 NOTE — [facility] Group Notes (Signed)
[facility] Group Notes:  (Nursing/MHT/Case Management/Adjunct)  Date:  08/28/2014  Time:  3:53 PM  Type of Therapy:  Psychoeducational Skills  Participation Level:  Did Not Attend  Participation Quality:  Did Not Attend  Affect:  Did Not Attend  Cognitive:  Did Not Attend  Insight:  None  Engagement in Group:  Did Not Attend  Modes of Intervention:  Did Not Attend  Summary of Progress/Problems: Pt did attend healthy support systems group.   Benancio Deeds Shanta 08/28/2014, 3:53 PM

## 2014-08-28 NOTE — Progress Notes (Signed)
D: Pt presents anxious on approach. Pt smiling and talkative during shift assessment. Pt stated that she slept well last night and feels great this morning. Pt stated that she's been having difficulty sleeping and last night was the first night she was able to sleep. Pt verbalized that she continues to have racing thoughts throughout the day.  Pt reports having anxiety d/t having to find a place to stay once she is discharged. Pt denies suicidal thoughts or depression.  No AVH verbalized. Pt denies any withdrawal symptoms. Pt denies any side effects to meds. A: Medications administered as ordered per MD. Verbal support given. Pt encouraged to attend groups. Pt encouraged to report increased symptoms. 15 minute checks performed for safety.  R: Pt receptive to treatment.
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
## 2014-08-28 NOTE — BHH Group Notes (Signed)
[facility] Group Notes:  (Clinical Social Work)  08/28/2014  10:00-11:00AM  Summary of Progress/Problems:   The main focus of today's process group was to   1)  discuss the importance of adding supports  2)  define health supports versus unhealthy supports  3)  identify the patient's current unhealthy supports and plan how to handle them  4)  Identify the patient's current healthy supports and plan what to add.  An emphasis was placed on using counselor, doctor, therapy groups, 12-step groups, and problem-specific support groups to expand supports.    The patient expressed full comprehension of the concepts presented, and agreed that there is a need to add more supports.  The patient stated her healthy supports include her daughter, dogs, co-workers, father, mother, brother, and a friend.  She would like to add AA groups.  Her other daughter, boyfriend and a friend with whom she got a DWI are her unhealthy supports.  She has a specific plan to leave the boyfriend's home, live with her daughter temporarily, and arrange for additional supports including AA, a sponsor, and more.  Type of Therapy:  Process Group with Motivational Interviewing  Participation Level:  Active  Participation Quality:  Attentive, Sharing and Supportive  Affect:  Appropriate  Cognitive:  Appropriate  Insight:  Improving  Engagement in Therapy:  Engaged  Modes of Intervention:   Education, Support and Processing, Activity  [employer], LCSW 08/28/2014, 12:15pm

## 2014-08-28 NOTE — Progress Notes (Addendum)
[facility] MD Progress Note  08/28/2014 1:34 PM Patricia [name]  MRN:  [id number]  Subjective: Patricia [name] says, "I feel good today. I'm happy. But, I still have racing minds. I don't know how to control it".  Principal Problem: Severe major depression without psychotic features Diagnosis:   Patient Active Problem List   Diagnosis Date Noted  . Alcohol dependence with uncomplicated withdrawal [H37.169] 08/26/2014  . Overdose of benzodiazepine [T42.4X1A] 08/26/2014  . Suicide attempt [T14.91] 08/26/2014  . Depression, major, single episode, severe [F32.2] 08/26/2014  . Anxiety [F41.9] 08/26/2014  . 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 Suicidal ideation [R45.851]   . Depressive disorder, not elsewhere classified [F32.9] 02/10/2013  . Essential hypertension, benign [I10] 02/10/2013   Total Time spent with patient: 35 minutes  Past Medical History:  Past Medical History  Diagnosis Date  . DVT (deep venous thrombosis)   . Anxiety   . Hypertension   . 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 Cesarean section    . Breast surgery    . Cosmetic surgery    . Colonoscopy  2006    Dr. Nyra Capes in [place]   Family History:  Family History  Problem Relation Age of Onset  . Colon cancer Neg Hx   . Rectal cancer Neg Hx   . Stomach cancer Neg Hx   . Esophageal cancer Maternal Grandfather    Social History:  History  Alcohol Use  . 7.0 oz/week  . 14 drink(s) per week     History  Drug Use No    History   Social History  . Marital Status: Unknown    Spouse Name: N/A  . Number of Children: N/A  . Years of Education: N/A   Social History Main Topics  . Smoking status: Current Every Day Smoker -- 0.50 packs/day for 30 years  . Smokeless tobacco: Never Used  . Alcohol Use: 7.0 oz/week    14 drink(s) per week  . Drug Use: No  . Sexual Activity: Yes    Other Topics Concern  . None   Social History Narrative   Additional History:    Sleep: Good  Appetite:  Good  Assessment: Patricia [name] is seen, chart reviewed. She is visible on the unit. She is participating in group sessions. She is invovled in her care. She is still presenting with 'racing mind' as she described it. However, from observation, she seem euphoric and excited. She is tolerating her treatment regimen. No adverse effects reported. Will add Seroquel 50 mg Q hs for mood control,  Musculoskeletal: Strength & Muscle Tone: within normal limits Gait & Station: normal Patient leans: N/A  Psychiatric Specialty Exam: Physical Exam  Review of Systems  Constitutional: Negative.   HENT: Negative.   Eyes: Negative.   Respiratory: Negative.   Cardiovascular: Negative.   Gastrointestinal: Negative.   Genitourinary: Negative.   Musculoskeletal: Negative.   Skin: Negative.   Endo/Heme/Allergies: Negative.   Psychiatric/Behavioral: Positive for depression (improving), hallucinations (Hx of) and substance abuse (Alcoholism). Negative for suicidal ideas. The patient has insomnia (Improving). The patient is not nervous/anxious.     Blood pressure 124/86, pulse 90, temperature 98.8 F (37.1 C), temperature source Oral, resp. rate 18, height 5' (1.524 m), weight 71.215 kg (157 lb), SpO2 96 %.Body mass index is 30.66 kg/(m^2).  General Appearance: Casual  Eye Contact::  Good  Speech:  Clear and Coherent  Volume:  Increased  Mood:  Euphoric  Affect:  Full Range  Thought Process:  Coherent and Logical  Orientation:  Full (Time, Place, and Person)  Thought Content:  Rumination and Denies any hallucintation  Suicidal Thoughts:  No  Homicidal Thoughts:  No  Memory:  Immediate;   Good Recent;   Good Remote;   Good  Judgement:  Fair  Insight:  Present  Psychomotor Activity:  Increased  Concentration:  Fair  Recall:  Good  Fund of Knowledge:Fair  Language: Good  Akathisia:  No   Handed:  Right  AIMS (if indicated):     Assets:  Desire for Improvement  ADL's:  Intact  Cognition: WNL  Sleep:  Number of Hours: 6   Current Medications: Current Facility-Administered Medications  Medication Dose Route Frequency Provider Last Rate Last Dose  . acetaminophen (TYLENOL) tablet 650 mg  650 mg Oral Q6H PRN Waylan Boga, NP      . alum & mag hydroxide-simeth (MAALOX/MYLANTA) 200-200-20 MG/5ML suspension 30 mL  30 mL Oral Q4H PRN Waylan Boga, NP      . aspirin tablet 325 mg  325 mg Oral Daily Waylan Boga, NP   325 mg at 08/28/14 0826  . escitalopram (LEXAPRO) tablet 20 mg  20 mg Oral Daily Waylan Boga, NP   20 mg at 08/28/14 0826  . lisinopril (PRINIVIL,ZESTRIL) tablet 10 mg  10 mg Oral Daily Waylan Boga, NP   10 mg at 08/28/14 9629   And  . hydrochlorothiazide (MICROZIDE) capsule 12.5 mg  12.5 mg Oral Daily Waylan Boga, NP   12.5 mg at 08/28/14 0826  . hydrOXYzine (ATARAX/VISTARIL) tablet 25 mg  25 mg Oral Q6H PRN Encarnacion Slates, NP   25 mg at 08/28/14 0010  . Influenza vac split quadrivalent PF (FLUARIX) injection 0.5 mL  0.5 mL Intramuscular Tomorrow-1000 Nicholaus Bloom, MD      . loperamide (IMODIUM) capsule 2-4 mg  2-4 mg Oral PRN Encarnacion Slates, NP      . LORazepam (ATIVAN) tablet 1 mg  1 mg Oral Q6H PRN Encarnacion Slates, NP      . LORazepam (ATIVAN) tablet 1 mg  1 mg Oral TID Encarnacion Slates, NP   1 mg at 08/28/14 1207   Followed by  . [START ON 08/29/2014] LORazepam (ATIVAN) tablet 1 mg  1 mg Oral BID Encarnacion Slates, NP       Followed by  . [START ON 08/30/2014] LORazepam (ATIVAN) tablet 1 mg  1 mg Oral Daily Encarnacion Slates, NP      . magnesium hydroxide (MILK OF MAGNESIA) suspension 30 mL  30 mL Oral Daily PRN Waylan Boga, NP      . multivitamin with minerals tablet 1 tablet  1 tablet Oral Daily Encarnacion Slates, NP   1 tablet at 08/28/14 0826  . nicotine (NICODERM CQ - dosed in mg/24 hours) patch 21 mg  21 mg Transdermal Daily Waylan Boga, NP   21 mg at 08/28/14 0800  .  ondansetron (ZOFRAN-ODT) disintegrating tablet 4 mg  4 mg Oral Q6H PRN Encarnacion Slates, NP      . pneumococcal 23 valent vaccine (PNU-IMMUNE) injection 0.5 mL  0.5 mL Intramuscular Tomorrow-1000 Nicholaus Bloom, MD      . potassium chloride (K-DUR,KLOR-CON) CR tablet 10 mEq  10 mEq Oral Daily Waylan Boga, NP   10 mEq at 08/28/14 0826  .  thiamine (B-1) injection 100 mg  100 mg Intramuscular Once Encarnacion Slates, NP   100 mg at 08/27/14 1024  . thiamine (VITAMIN B-1) tablet 100 mg  100 mg Oral Daily Encarnacion Slates, NP   100 mg at 08/28/14 0800  . traZODone (DESYREL) tablet 100 mg  100 mg Oral QHS Encarnacion Slates, NP   100 mg at 08/27/14 2103   Lab Results: No results found for this or any previous visit (from the past 48 hour(s)).  Physical Findings: AIMS: Facial and Oral Movements Muscles of Facial Expression: None, normal Lips and Perioral Area: None, normal Jaw: None, normal Tongue: None, normal,Extremity Movements Upper (arms, wrists, hands, fingers): None, normal Lower (legs, knees, ankles, toes): None, normal, Trunk Movements Neck, shoulders, hips: None, normal, Overall Severity Severity of abnormal movements (highest score from questions above): None, normal Incapacitation due to abnormal movements: None, normal Patient's awareness of abnormal movements (rate only patient's report): No Awareness, Dental Status Current problems with teeth and/or dentures?: No Does patient usually wear dentures?: No  CIWA:  CIWA-Ar Total: 0 COWS:     Treatment Plan Summary: Daily contact with patient to assess and evaluate symptoms and progress in treatment and Medication management: 1. Continue crisis management, mood stabilization & relapse prevention.. 2. Continue current medication management to reduce current symptoms to base line and improve the  patient's overall level of functioning; Ativan detox protocol, Lexapro 20 mg for depression, initiate Trazodone 100 mg Q bedtime for sleep, add Seroquel 50 mg  for mood control. 3. Treat health problems as indicated; Lisinopril-HCTZ for HTN, K-dur 10 meq for low potassium. 4. Develop treatment plan to enhance medication adeherance upon discharge and the need for  readmission. 5. Psycho-social education regarding relapse prevention and self care.    Medical Decision Making:  Established Problem, Stable/Improving (1), Review of Psycho-Social Stressors (1), Review of Medication Regimen & Side Effects (2) and Review of New Medication or Change in Dosage (2)  Encarnacion Slates, PMHNP-BC 08/28/2014, 1:34 PM I agreed with the findings, treatment and disposition plan of this patient. Berniece Andreas, MD

## 2014-08-29 DIAGNOSIS — F10230 Alcohol dependence with withdrawal, uncomplicated: Secondary | ICD-10-CM

## 2014-08-29 MED ORDER — ZOLPIDEM TARTRATE 10 MG PO TABS
10.0000 mg | ORAL_TABLET | Freq: Every evening | ORAL | Status: DC | PRN
  Administered 2014-08-29 – 2014-08-30 (×2): 10 mg via ORAL
  Filled 2014-08-29 (×2): qty 1

## 2014-08-29 NOTE — Progress Notes (Signed)
Pt denies withdrawal symptoms and says those have been well cared for while here. She was pleased that she has Ambien PRN available for sleep. She wants to continue to abstain from smoking after discharge. We discussed some programs/techniques that may help her do that. She says she may struggle more with quitting nicotine than with quitting alcohol. Support/encouragement provided. Pt's mood is bright and she is interacting assertively with peers. Will continue to monitor for needs/safety.

## 2014-08-29 NOTE — Progress Notes (Signed)
Pt is pleasant this am. Pt stated last pm she woke up a lot. She would like to be able to take more sleeping meds at night. Pt rates her depression a 0/10 and her anxiety a 9/10. She would like to stay focussed on team work and group help. Pt would like to find a permanent living situation. Pt does contract for safety and denies SI and HI. She has been attending groups and appears to enjoy being around the other pts.

## 2014-08-29 NOTE — Progress Notes (Signed)
D   Pt is pleasant on approach   She interacts well with staff and her peers   She attends and participates in groups   She reports trouble sleeping and requested sleep medication tonight   Pt is positive in her verbalizations and reports she plans to make some lifestyle changes to support sobriety A   Verbal support and encouragement given   Medications administered and effectiveness monitored   Q 15 min checks    R   Pt safe at present
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
## 2014-08-29 NOTE — BHH Group Notes (Signed)
Adult Psychoeducational Group Note  Date:  08/29/2014 Time:  11:38 PM  Group Topic/Focus:  Wrap-Up Group:   The focus of this group is to help patients review their daily goal of treatment and discuss progress on daily workbooks.  Participation Level:  Active  Participation Quality:  Appropriate  Affect:  Appropriate  Cognitive:  Appropriate  Insight: Appropriate  Engagement in Group:  Engaged  Modes of Intervention:  Discussion  Additional Comments:  Patricia [name] stated her day was wonderful.  She expressed her mom is picking her up for discharge on Wednesday and that she is leaving her boyfriend.  Victorino Sparrow A 08/29/2014, 11:38 PM
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
## 2014-08-29 NOTE — Progress Notes (Addendum)
D: Pt's mood is pleasant. She can be intrusive at times. Pt rates her depression low and states her day was good. A: Support given. Verbalization encouraged. Pt encouraged to come to nurse with any concerns. Medications given as prescribed. R: Pt is receptive. No complaints of pain or discomfort. Q15 min safety checks maintained. Will continue to monitor.

## 2014-08-30 MED ORDER — ACAMPROSATE CALCIUM 333 MG PO TBEC
666.0000 mg | DELAYED_RELEASE_TABLET | Freq: Three times a day (TID) | ORAL | Status: DC
  Administered 2014-08-30 – 2014-08-31 (×4): 666 mg via ORAL
  Filled 2014-08-30 (×3): qty 2
  Filled 2014-08-30 (×2): qty 18
  Filled 2014-08-30 (×3): qty 2
  Filled 2014-08-30: qty 18
  Filled 2014-08-30: qty 2

## 2014-08-30 NOTE — Progress Notes (Signed)
D   Pt is pleasant on approach   She interacts well with staff and her peers   She attends and participates in groups   She reports trouble sleeping and requested sleep medication tonight   Pt is positive in her verbalizations and reports she plans to make some lifestyle changes to support sobriety   Pt complained of lack of milk and snacks   She reports mild symptoms of withdrawal  A   Verbal support and encouragement given   Medications administered and effectiveness monitored   Q 15 min checks   Provided pt with milk and snacks  R   Pt safe at present reports grateful for staff responding to her requests

## 2014-08-30 NOTE — Progress Notes (Signed)
D: Patient denies SI/HI and A/V hallucinations; patient reports sleep last night was " great"; patient reports that appetite is good; reports energy level is high; reports ability to concentrate is good; rates depression 0/10; hopelessness 0/10; anxiety 8/10; patient reports that she is experiencing cravings and also that she woke up soaked because of sweating but denies sweats this morning; patient reports that she wants to work on her discharge plan for tomorrow   A: Monitored q 15 minutes; patient encouraged to attend groups; patient educated about medications; patient given medications per physician orders; patient encouraged to express feelings and/or concerns  R: Patient is cooperative and pleasant; patient is appropriate to circumstances; patient is elated and hyperactive this morning; patient is taking medications and tolerating medications; patient is attending groups and engaging; patient is talkative and assertive about her needs and concerns if there are any

## 2014-08-30 NOTE — BHH Group Notes (Signed)
The focus of this group is to educate the patient on the purpose and policies of crisis stabilization and provide a format to answer questions about their admission.  The group details unit policies and expectations of patients while admitted. Patient attended 0900 nursing group.

## 2014-08-30 NOTE — BHH Group Notes (Signed)
Adult Psychoeducational Group Note  Date:  08/30/2014 Time:  9:54 PM  Group Topic/Focus:  Wrap-Up Group:   The focus of this group is to help patients review their daily goal of treatment and discuss progress on daily workbooks.  Participation Level:  Active  Participation Quality:  Appropriate  Affect:  Appropriate  Cognitive:  Appropriate  Insight: Appropriate  Engagement in Group:  Engaged  Modes of Intervention:  Discussion  Additional Comments:  Fabulous Day per patient. Discharging tomorrow.  Patricia [name] 08/30/2014, 9:54 PM

## 2014-08-30 NOTE — BHH Suicide Risk Assessment (Signed)
[place] INPATIENT:  Family/Significant Other Suicide Prevention Education  Suicide Prevention Education:  Education Completed; Gertha Calkin (pt's daughter) [phone] has been identified by the patient as the family member/significant other with whom the patient will be residing, and identified as the person(s) who will aid the patient in the event of a mental health crisis (suicidal ideations/suicide attempt).  With written consent from the patient, the family member/significant other has been provided the following suicide prevention education, prior to the and/or following the discharge of the patient.  The suicide prevention education provided includes the following:  Suicide risk factors  Suicide prevention and interventions  National Suicide Hotline telephone number  [facility] assessment telephone number  [facility] Emergency Assistance [place] and/or Residential Mobile Crisis Unit telephone number  Request made of family/significant other to:  Remove weapons (e.g., guns, rifles, knives), all items previously/currently identified as safety concern.    Remove drugs/medications (over-the-counter, prescriptions, illicit drugs), all items previously/currently identified as a safety concern.  The family member/significant other verbalizes understanding of the suicide prevention education information provided.  The family member/significant other agrees to remove the items of safety concern listed above.  Smart, Nataniel Gasper LCSWA 08/30/2014, 11:28 AM
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
## 2014-08-30 NOTE — Progress Notes (Signed)
Recreation Therapy Notes  Animal-Assisted Activity/Therapy (AAA/T) Program Checklist/Progress Notes Patient Eligibility Criteria Checklist & Daily Group note for Rec Tx Intervention  Date: 03.01.2016 Time: 2:45pm Location: [address] [employer]   AAA/T Program Assumption of Risk Form signed by Patient/ or Parent Legal Guardian yes  Patient is free of allergies or sever asthma yes  Patient reports no fear of animals yes  Patient reports no history of cruelty to animals yes  Patient understands his/her participation is voluntary yes  Patient washes hands before animal contact yes  Patient washes hands after animal contact yes  Behavioral Response: Appropriate   Education: Hand Washing, Appropriate Animal Interaction   Education Outcome: Acknowledges education.   Clinical Observations/Feedback: Patient interacted appropriately with therapy dog, petting him appropriately. Additionally patient asked appropriate questions about therapy dog and his training, as well as shared stories about her pets at home with group.   Patricia [name] Patricia [name], LRT/CTRS  Lane Hacker 08/30/2014 7:36 PM

## 2014-08-30 NOTE — BHH Group Notes (Signed)
[facility] LCSW Group Therapy  08/30/2014 4:06 PM  Type of Therapy:  Group Therapy  Participation Level:  Active  Participation Quality:  Attentive  Affect:  Appropriate  Cognitive:  Alert and Oriented  Insight:  Engaged  Engagement in Therapy:  Engaged  Modes of Intervention:  Confrontation, Discussion, Education, Exploration, Problem-solving, Rapport Building, Socialization and Support  Summary of Progress/Problems: Feelings around Relapse. Group members discussed the meaning of relapse and shared personal stories of relapse, how it affected them and others, and how they perceived themselves during this time. Group members were encouraged to identify triggers, warning signs and coping skills used when facing the possibility of relapse. Social supports were discussed and explored in detail. Post Acute Withdrawal Syndrome (handout provided) was introduced and examined. Pt's were encouraged to ask questions, talk about key points associated with PAWS, and process this information in terms of relapse prevention. Patricia [name] was attentive and engaged during today's processing group. She talked about her latest relapse and her need to be aware of triggers and her emotions. Patricia [name] shared her concerns about PAWS and talked about a need for her to figure out how to cope with "the bad days." She continues to show progress in the group setting and improving insight.    Smart, Jonny Dearden LCSWA  08/30/2014, 4:06 PM

## 2014-08-30 NOTE — Progress Notes (Signed)
[facility] MD Progress Note  08/30/2014 2:42 PM Patricia [name]  MRN:  [id number] Subjective:  Patricia [name] states that she is making the necessary changes to be able accomplish her goals of sobriety and a "happy" life. She is not going back with her BF. Her mother is going to pick her up and take her to the place so she can pick some things up. She states he is welcome to be there or not as nothing is going to change her decision. She is then planning to stay with her daughter at least temporarily while she gets a place for herself. She is planning to go back to work as she likes it ant they are very supportive. She is planning to put exercise in place of drinking when she gets home after work. She is also going to an [place] meeting after the exercise. States that she is committed as does not want to get herself to this point ever again. States she indeed craves alcohol and she got also to take, overtake benzodiazepines. Did sleep really good last night but had some sweating. Principal Problem: Alcohol dependence Diagnosis:   Patient Active Problem List   Diagnosis Date Noted  . Alcohol dependence [F10.20] 08/28/2014  . Alcohol dependence with uncomplicated withdrawal [V56.433] 08/26/2014  . Overdose of benzodiazepine [T42.4X1A] 08/26/2014  . Suicide attempt [T14.91] 08/26/2014  . Depression, major, single episode, severe [F32.2] 08/26/2014  . Anxiety [F41.9] 08/26/2014  . 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 Suicidal ideation [R45.851]   . Depressive disorder, not elsewhere classified [F32.9] 02/10/2013  . Essential hypertension, benign [I10] 02/10/2013   Total Time spent with patient: 30 minutes   Past Medical History:  Past Medical History  Diagnosis Date  . DVT (deep venous thrombosis)   . Anxiety   . Hypertension   . 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 Cesarean  section    . Breast surgery    . Cosmetic surgery    . Colonoscopy  2006    Dr. Nyra Capes in [place]   Family History:  Family History  Problem Relation Age of Onset  . Colon cancer Neg Hx   . Rectal cancer Neg Hx   . Stomach cancer Neg Hx   . Esophageal cancer Maternal Grandfather    Social History:  History  Alcohol Use  . 7.0 oz/week  . 14 drink(s) per week     History  Drug Use No    History   Social History  . Marital Status: Unknown    Spouse Name: N/A  . Number of Children: N/A  . Years of Education: N/A   Social History Main Topics  . Smoking status: Current Every Day Smoker -- 0.50 packs/day for 30 years  . Smokeless tobacco: Never Used  . Alcohol Use: 7.0 oz/week    14 drink(s) per week  . Drug Use: No  . Sexual Activity: Yes   Other Topics Concern  . None   Social History Narrative   Additional History:    Sleep: Fair  Appetite:  Fair   Assessment:   Musculoskeletal: Strength & Muscle Tone: within normal limits Gait & Station: normal Patient leans: N/A   Psychiatric Specialty Exam: Physical Exam  Review of Systems  Constitutional: Negative.   HENT: Negative.   Eyes: Negative.   Respiratory: Negative.  Cardiovascular: Negative.   Gastrointestinal: Negative.   Genitourinary: Negative.   Musculoskeletal: Negative.   Skin: Negative.   Neurological: Negative.   Endo/Heme/Allergies: Negative.   Psychiatric/Behavioral: Positive for depression and substance abuse. The patient is nervous/anxious.     Blood pressure 125/85, pulse 101, temperature 98.4 F (36.9 C), temperature source Oral, resp. rate 18, height 5' (1.524 m), weight 71.215 kg (157 lb), SpO2 99 %.Body mass index is 30.66 kg/(m^2).  General Appearance: Fairly Groomed  Engineer, water::  Fair  Speech:  Clear and Coherent  Volume:  Normal  Mood:  Anxious  Affect:  Appropriate  Thought Process:  Coherent and Goal Directed  Orientation:  Full (Time, Place, and Person)  Thought  Content:  symptoms events worries concerns  Suicidal Thoughts:  No  Homicidal Thoughts:  No  Memory:  Immediate;   Fair Recent;   Fair Remote;   Fair  Judgement:  Fair  Insight:  Present  Psychomotor Activity:  Restlessness  Concentration:  Fair  Recall:  [employer] of Knowledge:Fair  Language: Fair  Akathisia:  No  Handed:  Right  AIMS (if indicated):     Assets:  Desire for Improvement Housing Social Support Vocational/Educational  ADL's:  Intact  Cognition: WNL  Sleep:  Number of Hours: 5.25     Current Medications: Current Facility-Administered Medications  Medication Dose Route Frequency Provider Last Rate Last Dose  . acamprosate (CAMPRAL) tablet 666 mg  666 mg Oral TID WC Nicholaus Bloom, MD   666 mg at 08/30/14 1201  . acetaminophen (TYLENOL) tablet 650 mg  650 mg Oral Q6H PRN Waylan Boga, NP      . alum & mag hydroxide-simeth (MAALOX/MYLANTA) 200-200-20 MG/5ML suspension 30 mL  30 mL Oral Q4H PRN Waylan Boga, NP      . aspirin tablet 325 mg  325 mg Oral Daily Waylan Boga, NP   325 mg at 08/30/14 0803  . escitalopram (LEXAPRO) tablet 20 mg  20 mg Oral Daily Waylan Boga, NP   20 mg at 08/30/14 0803  . lisinopril (PRINIVIL,ZESTRIL) tablet 10 mg  10 mg Oral Daily Waylan Boga, NP   10 mg at 08/30/14 3299   And  . hydrochlorothiazide (MICROZIDE) capsule 12.5 mg  12.5 mg Oral Daily Waylan Boga, NP   12.5 mg at 08/30/14 0803  . magnesium hydroxide (MILK OF MAGNESIA) suspension 30 mL  30 mL Oral Daily PRN Waylan Boga, NP      . multivitamin with minerals tablet 1 tablet  1 tablet Oral Daily Encarnacion Slates, NP   1 tablet at 08/30/14 0803  . nicotine (NICODERM CQ - dosed in mg/24 hours) patch 21 mg  21 mg Transdermal Daily Waylan Boga, NP   21 mg at 08/30/14 0804  . potassium chloride (K-DUR,KLOR-CON) CR tablet 10 mEq  10 mEq Oral Daily Waylan Boga, NP   10 mEq at 08/30/14 0803  . thiamine (B-1) injection 100 mg  100 mg Intramuscular Once Encarnacion Slates, NP   100 mg at  08/27/14 1024  . thiamine (VITAMIN B-1) tablet 100 mg  100 mg Oral Daily Encarnacion Slates, NP   100 mg at 08/30/14 0803  . traZODone (DESYREL) tablet 100 mg  100 mg Oral QHS Encarnacion Slates, NP   100 mg at 08/29/14 2121  . zolpidem (AMBIEN) tablet 10 mg  10 mg Oral QHS PRN Nicholaus Bloom, MD   10 mg at 08/29/14 2121    Lab Results: No results  found for this or any previous visit (from the past 48 hour(s)).  Physical Findings: AIMS: Facial and Oral Movements Muscles of Facial Expression: None, normal Lips and Perioral Area: None, normal Jaw: None, normal Tongue: None, normal,Extremity Movements Upper (arms, wrists, hands, fingers): None, normal Lower (legs, knees, ankles, toes): None, normal, Trunk Movements Neck, shoulders, hips: None, normal, Overall Severity Severity of abnormal movements (highest score from questions above): None, normal Incapacitation due to abnormal movements: None, normal Patient's awareness of abnormal movements (rate only patient's report): No Awareness, Dental Status Current problems with teeth and/or dentures?: No Does patient usually wear dentures?: No  CIWA:  CIWA-Ar Total: 0 COWS:     Treatment Plan Summary: Daily contact with patient to assess and evaluate symptoms and progress in treatment and Medication management Supportive approach/coping skills/relapse prevention Alcohol Dependence: continue to work a relapse prevention plan Cravings for alcohol: will add Campral 333 mg two TID Major Depression: continue Lexapro 20 mg daily ( note: she had not been taking the Lexapro regularly and she was placed on a full dose what could explain some of the sweating vs. Protracted withdrawal)   Medical Decision Making:  Review of Psycho-Social Stressors (1), Review of Medication Regimen & Side Effects (2) and Review of New Medication or Change in Dosage (2)     Alton Tremblay A 08/30/2014, 2:42 PM

## 2014-08-31 ENCOUNTER — Encounter (HOSPITAL_COMMUNITY): Payer: Self-pay | Admitting: Registered Nurse

## 2014-08-31 DIAGNOSIS — F322 Major depressive disorder, single episode, severe without psychotic features: Secondary | ICD-10-CM | POA: Insufficient documentation

## 2014-08-31 MED ORDER — ZOLPIDEM TARTRATE 10 MG PO TABS: 10.0000 mg | ORAL_TABLET | Freq: Every evening | ORAL | Status: DC | PRN

## 2014-08-31 MED ORDER — ESCITALOPRAM OXALATE 20 MG PO TABS
20.0000 mg | ORAL_TABLET | Freq: Every day | ORAL | Status: DC
Start: 2014-08-31 — End: 2017-07-14

## 2014-08-31 MED ORDER — ADULT MULTIVITAMIN W/MINERALS CH: 1.0000 | ORAL_TABLET | Freq: Every day | ORAL | Status: DC

## 2014-08-31 MED ORDER — LISINOPRIL-HYDROCHLOROTHIAZIDE 10-12.5 MG PO TABS: ORAL_TABLET | ORAL | Status: DC

## 2014-08-31 MED ORDER — ACAMPROSATE CALCIUM 333 MG PO TBEC: 666.0000 mg | DELAYED_RELEASE_TABLET | Freq: Three times a day (TID) | ORAL | Status: DC

## 2014-08-31 MED ORDER — POTASSIUM CHLORIDE CRYS ER 10 MEQ PO TBCR: 10.0000 meq | EXTENDED_RELEASE_TABLET | Freq: Every day | ORAL | Status: DC

## 2014-08-31 MED ORDER — TRAZODONE HCL 100 MG PO TABS: 100.0000 mg | ORAL_TABLET | Freq: Every day | ORAL | Status: DC

## 2014-08-31 NOTE — Progress Notes (Signed)
Patient is discharged per physician order; patient denies SI/HI and A/V hallucinations; patient received samples, prescriptions, and copy of AVS after it was reviewed; patient had no other questions or concerns at this time; patient verbalized and signed that they received all belongings; patient left the unit ambulatory

## 2014-08-31 NOTE — BHH Suicide Risk Assessment (Signed)
[facility] Discharge Suicide Risk Assessment   Demographic Factors:  Divorced or widowed and Caucasian  Total Time spent with patient: 30 minutes  Musculoskeletal: Strength & Muscle Tone: within normal limits Gait & Station: normal Patient leans: N/A  Psychiatric Specialty Exam: Physical Exam  Review of Systems  Constitutional: Negative.   HENT: Negative.   Eyes: Negative.   Respiratory: Negative.   Cardiovascular: Negative.   Gastrointestinal: Negative.   Genitourinary: Negative.   Musculoskeletal: Negative.   Skin: Negative.   Neurological: Negative.   Endo/Heme/Allergies: Negative.   Psychiatric/Behavioral: Positive for depression and substance abuse. The patient is nervous/anxious.     Blood pressure 133/91, pulse 93, temperature 97.9 F (36.6 C), temperature source Oral, resp. rate 18, height 5' (1.524 m), weight 71.215 kg (157 lb), SpO2 99 %.Body mass index is 30.66 kg/(m^2).  General Appearance: Fairly Groomed  Engineer, water::  Fair  Speech:  Clear and [id number]  Volume:  Normal  Mood:  Euthymic  Affect:  Appropriate  Thought Process:  Coherent and Goal Directed  Orientation:  Full (Time, Place, and Person)  Thought Content:  plans as she moves on, relapse prevention plan  Suicidal Thoughts:  No  Homicidal Thoughts:  No  Memory:  Immediate;   Fair Recent;   Fair Remote;   Fair  Judgement:  Fair  Insight:  Present  Psychomotor Activity:  Normal  Concentration:  Fair  Recall:  [employer] of [place]  Language: Fair  Akathisia:  No  Handed:  Right  AIMS (if indicated):     Assets:  Communication Skills Desire for Improvement Social Support Talents/Skills  Sleep:  Number of Hours: 5  Cognition: WNL  ADL's:  Intact   Have you used any form of tobacco in the last 30 days? (Cigarettes, Smokeless Tobacco, Cigars, and/or Pipes): Yes  Has this patient used any form of tobacco in the last 30 days? (Cigarettes, Smokeless Tobacco, Cigars, and/or Pipes) Yes, A  prescription for an FDA-approved tobacco cessation medication was offered at discharge and the patient refused  Mental Status Per Nursing Assessment::   On Admission:     Current Mental Status by Physician: In full contact with reality. There are no active S/S of withdrawal. There are no active SI plans or intent. She is willing and motivated to pursue further recovery work. She is planning to get an apartment for her and her dogs, close to her job, close to [employer] as well as the [place]. It is also close to [facility] but she states she is committed to abstinence. She has developed a solid prevention plan she is aware of her triggers and has developed ways of how to handle them.   Loss Factors: Loss of significant relationship  Historical Factors: NA  Risk Reduction Factors:   Sense of responsibility to family, Employed, Positive social support and Positive coping skills or problem solving skills  Continued Clinical Symptoms:  Depression:   Comorbid alcohol abuse/dependence Alcohol/Substance Abuse/Dependencies  Cognitive Features That Contribute To Risk:  None   Suicide Risk:  Minimal: No identifiable suicidal ideation.  Patients presenting with no risk factors but with morbid ruminations; may be classified as minimal risk based on the severity of the depressive symptoms  Principal Problem: Alcohol dependence Discharge Diagnoses:  Patient Active Problem List   Diagnosis Date Noted  . Alcohol dependence [F10.20] 08/28/2014  . Alcohol dependence with uncomplicated withdrawal [T24.580] 08/26/2014  . Overdose of benzodiazepine [T42.4X1A] 08/26/2014  . Suicide attempt [T14.91] 08/26/2014  .  Depression, major, single episode, severe [F32.2] 08/26/2014  . Anxiety [F41.9] 08/26/2014  . 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 Suicidal ideation [R45.851]   . Depressive disorder, not elsewhere classified [F32.9] 02/10/2013  . Essential  hypertension, benign [I10] 02/10/2013    Follow-up Information    Follow up with [facility] On 09/05/2014.   Why:  Assessment on this date at 8:00AM. This appt will last for about 1- 1 1/2 hours. Please bring photo Id with you to this appt. Thanks!    Contact information:   [address] [place], [place] [address] Phone: [phone] Fax: [phone]      Follow up with [facility].   Why:  Referral sent on 08/30/14 and referral still under review. Please call [phone] to get update on referral and review cost/financial information after discharge (if you are still interested in services).    Contact information:   [address] [place], [place] [address] Phone: [phone] Fax: [phone]      Follow up with [facility] On 09/06/2014.   Why:  Appt for bloodwork on this date at 9:00AM. Next appt on 09/13/14 at 11:00AM. If you have any issues getting mental health medication prescribed by [facility] or [facility], please notify your doctor.     Contact information:   ATTN: Dr. Ardeth Perfect MD [address] [place], [place] [address] Phone: [phone] Fax: [phone]      Plan Of Care/Follow-up recommendations:  Activity:  as tolerated Diet:  regular Follow up [facility], [facility] Is patient on multiple antipsychotic therapies at discharge:  No   Has Patient had three or more failed trials of antipsychotic monotherapy by history:  No  Recommended Plan for Multiple Antipsychotic Therapies: NA    Maddyx Vallie A 08/31/2014, 12:16 PM

## 2014-08-31 NOTE — BHH Group Notes (Signed)
[facility] LCSW Aftercare Discharge Planning Group Note   08/31/2014 9:34 AM  Participation Quality:  Appropriate   Mood/Affect:  Appropriate  Depression Rating:  0  Anxiety Rating:  10 "its a good nervous feeling." "I'm excited to start my new life."   Thoughts of Suicide:  No Will you contract for safety?   NA  Current AVH:  No  Plan for Discharge/Comments:  Pt reports she is d/cing today and is going to secure apt today. Pt reports that her mother does not want to take in her dogs and "that is not an option for me." Pt has follow-up in place at [facility] and referral pending at [facility]. PCP appt made as well. Pt given [facility] info and AA list for [facility].   Transportation Means: mother coming after lunch.   Supports: mother and daughter.   [employer]
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
## 2014-08-31 NOTE — Progress Notes (Signed)
  [facility] Adult Case Management Discharge Plan :  Will you be returning to the same living situation after discharge:  No.Pt's mother taking her to secure apt at d/c. At discharge, do you have transportation home?: Yes,  pt's mother coming after lunch to pick her up.  Do you have the ability to pay for your medications: Yes,  mental health-Pt has BCBS (no mental health coverage)  Release of information consent forms completed and submitted to medical records by CSW.  Patient to Follow up at: Follow-up Information    Follow up with [facility] On 09/05/2014.   Why:  Assessment on this date at 8:00AM. This appt will last for about 1- 1 1/2 hours. Please bring photo Id with you to this appt. Thanks!    Contact information:   [address] [place], [place] [address] Phone: [phone] Fax: [phone]      Follow up with [facility].   Why:  Referral sent on 08/30/14 and referral still under review. Please call [phone] to get update on referral and review cost/financial information after discharge (if you are still interested in services).    Contact information:   [place], [place] [address] Phone: [phone] Fax: [phone]      Follow up with [facility] On 09/06/2014.   Why:  Appt for bloodwork on this date at 9:00AM. Next appt on 09/13/14 at 11:00AM. If you have any issues getting mental health medication prescribed by [facility] or [facility], please notify your doctor.     Contact information:   ATTN: Dr. Ardeth Perfect MD [address] [place], [place] [address] Phone: [phone] Fax: [phone]      Patient denies SI/HI: Yes,  during group/self report.     Safety Planning and Suicide Prevention discussed: Yes,  SPE completed with pt. SPI pamphlet provided to pt and she was encouraged to share information with support network, ask questions, and talk about any concerns relating to SPE.  Have you used any form  of tobacco in the last 30 days? (Cigarettes, Smokeless Tobacco, Cigars, and/or Pipes): Yes  Has patient been referred to the Quitline?: Yes, faxed on 08/31/14  [name], [name] 08/31/2014, 9:37 AM

## 2014-09-02 NOTE — Progress Notes (Addendum)
Patient Discharge Instructions:  After Visit Summary (AVS):   Faxed to:  09/02/14 Discharge Summary Note:   Faxed to:  09/02/14 Psychiatric Admission Assessment Note:   Faxed to:  09/02/14 Suicide Risk Assessment - Discharge Assessment:   Faxed to:  09/02/14 Faxed/Sent to the Next Level Care provider:  09/02/14 Faxed to [facility] - Dr. Ardeth Perfect @ [phone] Faxed to [place] @ [phone] Faxed to [place] @ [phone]  Patsey Berthold, 09/02/2014, 3:20 PM

## 2014-12-13 ENCOUNTER — Encounter: Payer: Self-pay | Admitting: Surgery

## 2014-12-13 ENCOUNTER — Other Ambulatory Visit: Payer: Self-pay | Admitting: *Deleted

## 2014-12-13 DIAGNOSIS — I7092 Chronic total occlusion of artery of the extremities: Secondary | ICD-10-CM

## 2014-12-19 ENCOUNTER — Ambulatory Visit (INDEPENDENT_AMBULATORY_CARE_PROVIDER_SITE_OTHER): Payer: BLUE CROSS/BLUE SHIELD | Admitting: Surgery

## 2014-12-19 ENCOUNTER — Ambulatory Visit (HOSPITAL_COMMUNITY)
Admission: RE | Admit: 2014-12-19 | Discharge: 2014-12-19 | Disposition: A | Discharge status: Home / Self Care | Payer: BLUE CROSS/BLUE SHIELD | Source: Ambulatory Visit | Attending: Surgery | Admitting: Surgery

## 2014-12-19 ENCOUNTER — Encounter: Payer: Self-pay | Admitting: Surgery

## 2014-12-19 VITALS — BP 120/70 | HR 86 | Temp 98.2°F | Resp 16 | Ht 60.0 in | Wt 136.0 lb

## 2014-12-19 DIAGNOSIS — I739 Peripheral vascular disease, unspecified: Secondary | ICD-10-CM

## 2014-12-19 DIAGNOSIS — I7092 Chronic total occlusion of artery of the extremities: Secondary | ICD-10-CM | POA: Insufficient documentation

## 2014-12-19 MED ORDER — CILOSTAZOL 100 MG PO TABS: 100.0000 mg | ORAL_TABLET | Freq: Two times a day (BID) | ORAL | Status: DC

## 2014-12-19 MED ORDER — ATORVASTATIN CALCIUM 10 MG PO TABS: 10.0000 mg | ORAL_TABLET | Freq: Every day | ORAL | Status: DC

## 2014-12-19 NOTE — Progress Notes (Signed)
Referred by:  [name], MD [address] [place], [place] [address]  Reason for referral: Right LE claudication  History of Present Illness  Patricia [name] is a 54 y.o. ([birth year]) female who presents with chief complaint: right leg pain.  Onset of symptom occurred 7 years ago and is progressing.  Pain is described as tightness, pain is the right calf after walking 1/4 of a mile.  Rest helps the pain subside.  She has no rest pain and no history of ulcers.  Past medical history includes A fib no currently on anticoagulation therapy with reported history of right LE DVT times 2 events.  She takes Aspirin 325 mg daily.  She reports that she does not take a statin medication and no hypertensive medications.  She denise DM.  There is also a long history of tobacco abuse and she wants to stop smoking.    Past Medical History  Diagnosis Date  . DVT (deep venous thrombosis)   . Anxiety   . Hypertension   . Aneurysm     pt states, "I was told I have an aneurysm in my hear 6, 7 years ago"  . Anemia   . Peripheral vascular disease     Past Surgical History  Procedure Laterality Date  . Cesarean section    . Breast surgery    . Cosmetic surgery    . Colonoscopy  2006    Dr. Nyra Capes in [place]    History   Social History  . Marital Status: Unknown    Spouse Name: N/A  . Number of Children: N/A  . Years of Education: N/A   Occupational History  . Not on file.   Social History Main Topics  . Smoking status: Current Every Day Smoker -- 0.50 packs/day for 30 years  . Smokeless tobacco: Never Used  . Alcohol Use: 7.0 oz/week    14 Standard drinks or equivalent per week  . Drug Use: No  . Sexual Activity: Yes   Other Topics Concern  . Not on file   Social History Narrative    Family History  Problem Relation Age of Onset  . Colon cancer Neg Hx   . Rectal cancer Neg Hx   . Stomach cancer Neg Hx   . Esophageal cancer Maternal Grandfather   . Heart disease Mother       Current Outpatient Prescriptions on File Prior to Visit  Medication Sig Dispense Refill  . acamprosate (CAMPRAL) 333 MG tablet Take 2 tablets (666 mg total) by mouth 3 (three) times daily with meals. For alcoholism 90 tablet 0  . aspirin 325 MG tablet Take 325 mg by mouth daily.    Marland Kitchen lisinopril-hydrochlorothiazide (ZESTORETIC) 10-12.5 MG per tablet Take one tablet (10-125 mg) daily for depression 30 tablet 11  . Multiple Vitamin (MULTIVITAMIN WITH MINERALS) TABS tablet Take 1 tablet by mouth daily. For nutritional support (May be brought over the counter) 30 tablet 0  . escitalopram (LEXAPRO) 20 MG tablet Take 1 tablet (20 mg total) by mouth daily. For depression (Patient not taking: Reported on 12/19/2014) 30 tablet 0  . potassium chloride (K-DUR,KLOR-CON) 10 MEQ tablet Take 1 tablet (10 mEq total) by mouth daily. For decrease potassium (Patient not taking: Reported on 12/19/2014) 30 tablet 0  . traZODone (DESYREL) 100 MG tablet Take 1 tablet (100 mg total) by mouth at bedtime. For sleep (Patient not taking: Reported on 12/19/2014) 30 tablet 0  . zolpidem (AMBIEN) 10 MG tablet Take 1 tablet (  10 mg total) by mouth at bedtime as needed for sleep. 30 tablet 0   No current facility-administered medications on file prior to visit.    Allergies  Allergen Reactions  . Codeine Shortness Of Breath    REVIEW OF SYSTEMS:  (Positives checked otherwise negative)  CARDIOVASCULAR:  '[ ]'$  chest pain, '[ ]'$  chest pressure, '[ ]'$  palpitations, '[ ]'$  shortness of breath when laying flat, '[ ]'$  shortness of breath with exertion,   '[ ]'$  pain in feet when walking, '[ ]'$  pain in feet when laying flat, [x ] history of blood clot in veins (DVT), '[ ]'$  history of phlebitis, '[ ]'$  swelling in legs, '[ ]'$  varicose veins  PULMONARY:  '[ ]'$  productive cough, '[ ]'$  asthma, '[ ]'$  wheezing  NEUROLOGIC:  '[ ]'$  weakness in arms or legs, '[ ]'$  numbness in arms or legs, '[ ]'$  difficulty speaking or slurred speech, '[ ]'$  temporary loss of vision in one eye,  '[ ]'$  dizziness  HEMATOLOGIC:  '[ ]'$  bleeding problems, '[ ]'$  problems with blood clotting too easily  MUSCULOSKEL:  '[ ]'$  joint pain, '[ ]'$  joint swelling  GASTROINTEST:  '[ ]'$   Vomiting blood, '[ ]'$   Blood in stool     GENITOURINARY:  '[ ]'$   Burning with urination, '[ ]'$   Blood in urine  PSYCHIATRIC:  '[ ]'$  history of major depression  INTEGUMENTARY:  '[ ]'$  rashes, '[ ]'$  ulcers  CONSTITUTIONAL:  '[ ]'$  fever, '[ ]'$  chills  For VQI Use Only     Physical Examination Filed Vitals:   12/19/14 1454  BP: 120/70  Pulse: 86  Temp: 98.2 F (36.8 C)  TempSrc: Oral  Resp: 16  Height: 5' (1.524 m)  Weight: 136 lb (61.689 kg)  SpO2: 98%   Body mass index is 26.56 kg/(m^2).  General: A&O x 3, WDWN  Head: [place]/AT  Ear/Nose/Throat: Hearing grossly intact, nares w/o erythema or drainage, oropharynx w/o Erythema/Exudate  Eyes: pupils equal  Neck: Supple, no nuchal rigidity, no palpable LAD  Pulmonary: Sym exp, good air movt, CTAB, no rales, rhonchi, & wheezing  Cardiac: RRR, Nl S1, S2, no Murmurs, rubs or gallops Vascular: Vessel Right Left  Radial Palpable Palpable  Brachial  Palpable Palpable  Carotid Palpable, without bruit Palpable, without bruit  Aorta Not palpable N/A  Femoral Palpable Palpable  Popliteal Not palpable Not palpable  PT Palpable Palpable  DP notPalpable Palpable   Gastrointestinal: soft, NTND, -G/R, - HSM, - masses, - CVAT B  Musculoskeletal: M/S 5/5 throughout , Extremities without ischemic changes   Neurologic: CN 2-12 intact , Pain and light touch intact in extremities , Motor exam as listed above  Psychiatric: Judgment intact, Mood & affect appropriatefor pt's clinical situation, positive history of depression Dermatologic: See M/S exam for extremity exam, no rashes otherwise noted  Lymph : No Cervical, Axillary, or Inguinal lymphadenopathy   Non-Invasive Vascular Imaging  ABI (Date: 12/19/2014)  R: 0.58, monophasic flow starting at the popliteal with velocity  ration 4.5 50-99% stenosis.     Medical Decision Making  Patricia [name] is a 54 y.o. female who presents with: right LE symptoms of claudication. We have many options to help with her symptoms of claudication which were discussed today in the office with Dr. Trula Slade.  An angiogram with possible intervention or Femoral popliteal by pass would probably help her symptoms, but given her age of only 54 y.o. Stents only last 1 year in 20% of patients and by pass only works in 70%  of patients after 4 years.    We want to start her on a daily walking program for at least 30 min/day and cilostazole BID and atorvastatin 10 mg daily.  She is going to ask her primary care Physician Dr. Ardeth Perfect for chantix to help her stop smoking  She will follow up in 6 weeks.     Theda Sers Patricia [name] [facility] PA-C [facility] Office: [phone]   12/19/2014, 3:38 PM  I agree with the above.  I have seen and evaluated the patient.  She has a complicated past history.  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  She has a known PFO which was not closed.  She describes going through some form of lysis type procedure to the right leg.   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  I have reviewed her Doppler studies today which show an ankle-brachial index of 0.58 on the right with a stenosis in the popliteal artery.  Given her age, I stressed the importance of maximal medical therapy.  I stressed that she needed to stop smoking.  She states that she will start Chantix tomorrow.  Also told her that she needs to be placed on a statin.  I'm going to give her a trial of cilostazol to see if this improves her walking distance, so as to avoid or delay any intervention.  She will follow up in 6 weeks after she has completed her trial of cilostazol.  If she continues to have  leg pain, I will proceed with angiography and possible intervention.  Annamarie Major

## 2014-12-21 ENCOUNTER — Encounter: Payer: Self-pay | Admitting: Internal Medicine

## 2015-02-10 ENCOUNTER — Encounter: Payer: Self-pay | Admitting: Surgery

## 2015-02-13 ENCOUNTER — Ambulatory Visit: Payer: BLUE CROSS/BLUE SHIELD | Admitting: Surgery

## 2015-02-23 ENCOUNTER — Ambulatory Visit (INDEPENDENT_AMBULATORY_CARE_PROVIDER_SITE_OTHER): Payer: BLUE CROSS/BLUE SHIELD | Admitting: Obstetrics

## 2015-02-23 ENCOUNTER — Encounter: Payer: Self-pay | Admitting: Obstetrics

## 2015-02-23 VITALS — BP 155/85 | HR 84 | Temp 98.2°F | Ht 60.0 in | Wt 138.2 lb

## 2015-02-23 DIAGNOSIS — Z124 Encounter for screening for malignant neoplasm of cervix: Secondary | ICD-10-CM

## 2015-02-23 DIAGNOSIS — Z01419 Encounter for gynecological examination (general) (routine) without abnormal findings: Secondary | ICD-10-CM | POA: Diagnosis not present

## 2015-02-23 DIAGNOSIS — Z1239 Encounter for other screening for malignant neoplasm of breast: Secondary | ICD-10-CM

## 2015-02-23 NOTE — Progress Notes (Signed)
Subjective:        Patricia [name] is a 54 y.o. female here for a routine exam.  Current complaints: none.    Personal health questionnaire:  Is patient Patricia [name], have a family history of breast and/or ovarian cancer: no Is there a family history of uterine cancer diagnosed at age < 69, gastrointestinal cancer, urinary tract cancer, family member who is a Field seismologist syndrome-associated carrier: no Is the patient overweight and hypertensive, family history of diabetes, personal history of gestational diabetes, preeclampsia or PCOS: no Is patient over 27, have PCOS,  family history of premature CHD under age 64, diabetes, smoke, have hypertension or peripheral artery disease:  no At any time, has a partner hit, kicked or otherwise hurt or frightened you?: no Over the past 2 weeks, have you felt down, depressed or hopeless?: no Over the past 2 weeks, have you felt little interest or pleasure in doing things?:no   Gynecologic History No LMP recorded. Patient is postmenopausal. Contraception: post menopausal status Last Pap: 2014. Results were: normal Last mammogram: 2014. Results were: normal  Obstetric History OB History  No data available    Past Medical History  Diagnosis Date  . DVT (deep venous thrombosis)   . Anxiety   . Hypertension   . Aneurysm     pt states, "I was told I have an aneurysm in my hear 6, 7 years ago"  . Anemia   . Peripheral vascular disease     Past Surgical History  Procedure Laterality Date  . Cesarean section    . Breast surgery    . Cosmetic surgery    . Colonoscopy  2006    Dr. Nyra Capes in [place]     Current outpatient prescriptions:  .  aspirin 325 MG tablet, Take 325 mg by mouth daily., Disp: , Rfl:  .  atorvastatin (LIPITOR) 10 MG tablet, Take 1 tablet (10 mg total) by mouth daily., Disp: 30 tablet, Rfl: 1 .  lisinopril-hydrochlorothiazide (ZESTORETIC) 10-12.5 MG per tablet, Take one tablet (10-125 mg) daily for depression, Disp:  30 tablet, Rfl: 11 .  acamprosate (CAMPRAL) 333 MG tablet, Take 2 tablets (666 mg total) by mouth 3 (three) times daily with meals. For alcoholism (Patient not taking: Reported on 02/23/2015), Disp: 90 tablet, Rfl: 0 .  cilostazol (PLETAL) 100 MG tablet, Take 1 tablet (100 mg total) by mouth 2 (two) times daily before a meal. (Patient not taking: Reported on 02/23/2015), Disp: 60 tablet, Rfl: 4 .  escitalopram (LEXAPRO) 20 MG tablet, Take 1 tablet (20 mg total) by mouth daily. For depression (Patient not taking: Reported on 12/19/2014), Disp: 30 tablet, Rfl: 0 .  Multiple Vitamin (MULTIVITAMIN WITH MINERALS) TABS tablet, Take 1 tablet by mouth daily. For nutritional support (May be brought over the counter) (Patient not taking: Reported on 02/23/2015), Disp: 30 tablet, Rfl: 0 .  potassium chloride (K-DUR,KLOR-CON) 10 MEQ tablet, Take 1 tablet (10 mEq total) by mouth daily. For decrease potassium (Patient not taking: Reported on 12/19/2014), Disp: 30 tablet, Rfl: 0 .  traZODone (DESYREL) 100 MG tablet, Take 1 tablet (100 mg total) by mouth at bedtime. For sleep (Patient not taking: Reported on 12/19/2014), Disp: 30 tablet, Rfl: 0 .  zolpidem (AMBIEN) 10 MG tablet, Take 1 tablet (10 mg total) by mouth at bedtime as needed for sleep., Disp: 30 tablet, Rfl: 0 Allergies  Allergen Reactions  . Codeine Shortness Of Breath    Social History  Substance Use Topics  . Smoking  status: Current Every Day Smoker -- 0.50 packs/day for 30 years  . Smokeless tobacco: Never Used  . Alcohol Use: No    Family History  Problem Relation Age of Onset  . Colon cancer Neg Hx   . Rectal cancer Neg Hx   . Stomach cancer Neg Hx   . Esophageal cancer Maternal Grandfather   . Heart disease Mother       Review of Systems  Constitutional: negative for fatigue and weight loss Respiratory: negative for cough and wheezing Cardiovascular: negative for chest pain, fatigue and palpitations Gastrointestinal: negative for  abdominal pain and change in bowel habits Musculoskeletal:negative for myalgias Neurological: negative for gait problems and tremors Behavioral/Psych: negative for abusive relationship, depression Endocrine: negative for temperature intolerance   Genitourinary:negative for abnormal menstrual periods, genital lesions, hot flashes, sexual problems and vaginal discharge Integument/breast: negative for breast lump, breast tenderness, nipple discharge and skin lesion(s)    Objective:       BP 155/85 mmHg  Pulse 84  Temp(Src) 98.2 F (36.8 C)  Ht 5' (1.524 m)  Wt 138 lb 3.2 oz (62.687 kg)  BMI 26.99 kg/m2 General:   alert  Skin:   no rash or abnormalities  Lungs:   clear to auscultation bilaterally  Heart:   regular rate and rhythm, S1, S2 normal, no murmur, click, rub or gallop  Breasts:   normal without suspicious masses, skin or nipple changes or axillary nodes  Abdomen:  normal findings: no organomegaly, soft, non-tender and no hernia  Pelvis:  External genitalia: normal general appearance Urinary system: urethral meatus normal and bladder without fullness, nontender Vaginal: normal without tenderness, induration or masses Cervix: normal appearance Adnexa: normal bimanual exam Uterus: anteverted and non-tender, normal size   Lab Review Urine pregnancy test Labs reviewed yes Radiologic studies reviewed yes    Assessment:    Healthy female exam.    Plan:    Education reviewed: calcium supplements, low fat, low cholesterol diet, self breast exams and weight bearing exercise. Mammogram ordered. Follow up in: 1 year.   No orders of the defined types were placed in this encounter.   Orders Placed This Encounter  Procedures  . SureSwab Bacterial Vaginosis/itis  . MM Digital Screening    Office scheduled Barb    Standing Status: Future     Number of Occurrences:      Standing Expiration Date: 04/24/2016    Order Specific Question:  Reason for Exam (SYMPTOM  OR  DIAGNOSIS REQUIRED)    Answer:  no [email or web address]    Order Specific Question:  Is the patient pregnant?    Answer:  No    Order Specific Question:  Preferred imaging location?    Answer:  [facility]

## 2015-02-24 LAB — PAP IG AND HPV HIGH-RISK: HPV DNA High Risk: NOT DETECTED

## 2015-02-26 LAB — SURESWAB BACTERIAL VAGINOSIS/ITIS
Atopobium vaginae: 6.7 Log (cells/mL)
C. ALBICANS, DNA: NOT DETECTED
C. glabrata, DNA: NOT DETECTED
C. parapsilosis, DNA: NOT DETECTED
C. tropicalis, DNA: NOT DETECTED
LACTOBACILLUS SPECIES: NOT DETECTED Log (cells/mL)
MEGASPHAERA SPECIES: NOT DETECTED Log (cells/mL)
T. vaginalis RNA, QL TMA: NOT DETECTED

## 2015-02-27 ENCOUNTER — Other Ambulatory Visit: Payer: Self-pay | Admitting: Obstetrics

## 2015-02-27 DIAGNOSIS — B9689 Other specified bacterial agents as the cause of diseases classified elsewhere: Secondary | ICD-10-CM

## 2015-02-27 DIAGNOSIS — N760 Acute vaginitis: Principal | ICD-10-CM

## 2015-02-27 MED ORDER — METRONIDAZOLE 500 MG PO TABS: 500.0000 mg | ORAL_TABLET | Freq: Two times a day (BID) | ORAL | Status: DC

## 2015-02-28 ENCOUNTER — Other Ambulatory Visit: Payer: Self-pay | Admitting: Obstetrics

## 2015-02-28 ENCOUNTER — Ambulatory Visit (HOSPITAL_COMMUNITY)
Admission: RE | Admit: 2015-02-28 | Discharge: 2015-02-28 | Disposition: A | Discharge status: Home / Self Care | Payer: BLUE CROSS/BLUE SHIELD | Source: Ambulatory Visit | Attending: Obstetrics | Admitting: Obstetrics

## 2015-02-28 DIAGNOSIS — Z1231 Encounter for screening mammogram for malignant neoplasm of breast: Secondary | ICD-10-CM | POA: Diagnosis not present

## 2015-02-28 DIAGNOSIS — Z1239 Encounter for other screening for malignant neoplasm of breast: Secondary | ICD-10-CM

## 2016-02-26 ENCOUNTER — Ambulatory Visit: Payer: BLUE CROSS/BLUE SHIELD | Admitting: Obstetrics

## 2016-07-09 ENCOUNTER — Other Ambulatory Visit: Payer: Self-pay

## 2016-07-09 DIAGNOSIS — I70211 Atherosclerosis of native arteries of extremities with intermittent claudication, right leg: Secondary | ICD-10-CM

## 2016-08-12 ENCOUNTER — Ambulatory Visit (HOSPITAL_COMMUNITY): Payer: BLUE CROSS/BLUE SHIELD

## 2016-08-12 ENCOUNTER — Ambulatory Visit: Payer: BLUE CROSS/BLUE SHIELD | Admitting: Surgery

## 2017-06-30 DIAGNOSIS — I10 Essential (primary) hypertension: Secondary | ICD-10-CM | POA: Diagnosis not present

## 2017-06-30 DIAGNOSIS — Z0000 Encounter for general adult medical examination without abnormal findings: Secondary | ICD-10-CM | POA: Diagnosis not present

## 2017-06-30 DIAGNOSIS — E7849 Other hyperlipidemia: Secondary | ICD-10-CM | POA: Diagnosis not present

## 2017-07-07 DIAGNOSIS — Z0000 Encounter for general adult medical examination without abnormal findings: Secondary | ICD-10-CM | POA: Diagnosis not present

## 2017-07-07 DIAGNOSIS — Q211 Atrial septal defect: Secondary | ICD-10-CM | POA: Diagnosis not present

## 2017-07-07 DIAGNOSIS — I10 Essential (primary) hypertension: Secondary | ICD-10-CM | POA: Diagnosis not present

## 2017-07-07 DIAGNOSIS — R55 Syncope and collapse: Secondary | ICD-10-CM | POA: Diagnosis not present

## 2017-07-07 DIAGNOSIS — Z1389 Encounter for screening for other disorder: Secondary | ICD-10-CM | POA: Diagnosis not present

## 2017-07-07 DIAGNOSIS — E7849 Other hyperlipidemia: Secondary | ICD-10-CM | POA: Diagnosis not present

## 2017-07-11 ENCOUNTER — Telehealth: Payer: Self-pay

## 2017-07-11 NOTE — Telephone Encounter (Signed)
SENT REFERRAL TO SCHEDULING 

## 2017-07-14 DIAGNOSIS — I739 Peripheral vascular disease, unspecified: Secondary | ICD-10-CM | POA: Insufficient documentation

## 2017-07-14 DIAGNOSIS — Z86718 Personal history of other venous thrombosis and embolism: Secondary | ICD-10-CM

## 2017-07-14 DIAGNOSIS — E785 Hyperlipidemia, unspecified: Secondary | ICD-10-CM | POA: Insufficient documentation

## 2017-07-14 DIAGNOSIS — R55 Syncope and collapse: Secondary | ICD-10-CM

## 2017-07-15 ENCOUNTER — Ambulatory Visit: Payer: Self-pay | Admitting: Cardiology

## 2017-07-17 ENCOUNTER — Ambulatory Visit (INDEPENDENT_AMBULATORY_CARE_PROVIDER_SITE_OTHER): Payer: BLUE CROSS/BLUE SHIELD | Admitting: Cardiology

## 2017-07-17 ENCOUNTER — Encounter: Payer: Self-pay | Admitting: Cardiology

## 2017-07-17 VITALS — BP 124/80 | HR 69 | Ht 60.0 in | Wt 131.0 lb

## 2017-07-17 DIAGNOSIS — E782 Mixed hyperlipidemia: Secondary | ICD-10-CM

## 2017-07-17 DIAGNOSIS — Z86718 Personal history of other venous thrombosis and embolism: Secondary | ICD-10-CM

## 2017-07-17 DIAGNOSIS — Z8679 Personal history of other diseases of the circulatory system: Secondary | ICD-10-CM | POA: Diagnosis not present

## 2017-07-17 DIAGNOSIS — I739 Peripheral vascular disease, unspecified: Secondary | ICD-10-CM | POA: Insufficient documentation

## 2017-07-17 DIAGNOSIS — F17200 Nicotine dependence, unspecified, uncomplicated: Secondary | ICD-10-CM | POA: Diagnosis not present

## 2017-07-17 DIAGNOSIS — I10 Essential (primary) hypertension: Secondary | ICD-10-CM | POA: Diagnosis not present

## 2017-07-17 NOTE — Patient Instructions (Signed)
Medication Instructions:  Your physician recommends that you continue on your current medications as directed. Please refer to the Current Medication list given to you today.  Labwork: Your physician recommends that you have the following labs drawn: INR, protein c total and activity, protein s total and activity, factor 5 and stool guaiac   Testing/Procedures: Your physician has requested that you have an echocardiogram. Echocardiography is a painless test that uses sound waves to create images of your heart. It provides your doctor with information about the size and shape of your heart and how well your heart's chambers and valves are working. This procedure takes approximately one hour. There are no restrictions for this procedure.  Your physician has requested that you have a lower or upper extremity venous duplex. This test is an ultrasound of the veins in the legs or arms. It looks at venous blood flow that carries blood from the heart to the legs or arms. Allow one hour for a Lower Venous exam. Allow thirty minutes for an Upper Venous exam. There are no restrictions or special instructions.   Follow-Up: Your physician recommends that you schedule a follow-up appointment in: 3 weeks  Any Other Special Instructions Will Be Listed Below (If Applicable).     If you need a refill on your cardiac medications before your next appointment, please call your pharmacy.   [place], RN, BSN

## 2017-07-17 NOTE — Progress Notes (Signed)
Cardiology Consultation:    Date:  07/17/2017   ID:  Patricia [name], DOB [birth year], MRN [id number]  PCP:  [name], MD  Cardiologist:  Jenne Campus, MD   Referring MD: [name], MD   Chief Complaint  Patient presents with  . Establish Care  I had a history of blood plaque  History of Present Illness:    Patricia [name] is a 57 y.o. female who is being seen today for the evaluation of history of DVT at the request of [name], MD.  About 10 years ago she started experiencing pain in the right calf that was after all day of walking she also drinks some alcohol that day and then she was in the plane for about 3 hours she eventually ended up going to her physician she was find to have DVT in the right lower extremity.  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  Was given anticoagulation she was told that she will required anticoagulation until the end of her life.  She was put on Coumadin however he stopped that medication about a year later because she was annoyed with needs of frequent checks.  Overall she seems to be doing fine denies have any chest pain tightness squeezing pressure burning chest but does have exertional shortness of breath.  Also when she walks she develops pain in her right calf she stops pain goes away she described typical claudication.  That been going on for quite long time already.  Within last 2 years she lost significant amount of weight and since that time she said that is being better.  Past Medical History:  Diagnosis Date  . Anemia   . Aneurysm ([place])    pt states, "I was told I  have an aneurysm in my hear 6, 7 years ago"  . Anxiety   . DVT (deep venous thrombosis) ([place])   . Hypertension   . Peripheral vascular disease [facility])     Past Surgical History:  Procedure Laterality Date  . BREAST SURGERY    . CESAREAN SECTION    . COLONOSCOPY  2006   Dr. Nyra Capes in [place]  . COSMETIC SURGERY      Current Medications: Current Meds  Medication Sig  . aspirin 81 MG tablet Take 81 mg by mouth daily.   Marland Kitchen atorvastatin (LIPITOR) 10 MG tablet Take 1 tablet (10 mg total) by mouth daily.  Marland Kitchen lisinopril-hydrochlorothiazide (ZESTORETIC) 10-12.5 MG per tablet Take one tablet (10-125 mg) daily for depression  . varenicline (CHANTIX STARTING MONTH PAK) 0.5 MG X 11 & 1 MG X 42 tablet Take by mouth 2 (two) times daily. Take one 0.5 mg tablet by mouth once daily for 3 days, then increase to one 0.5 mg tablet twice daily for 4 days, then increase to one 1 mg tablet twice daily.     Allergies:   Codeine   Social History   Socioeconomic History  . Marital status: Unknown    Spouse name: None  . Number of children: None  . Years of education: None  . Highest education level: None  Social Needs  .  Financial resource strain: None  . Food insecurity - worry: None  . Food insecurity - inability: None  . Transportation needs - medical: None  . Transportation needs - non-medical: None  Occupational History  . None  Tobacco Use  . Smoking status: Current Every Day Smoker    Packs/day: 0.50    Years: 30.00    Pack years: 15.00  . Smokeless tobacco: Never Used  Substance and Sexual Activity  . Alcohol use: Yes    Alcohol/week: 12.6 oz    Types: 7 Glasses of wine, 14 Standard drinks or equivalent per week    Comment: Glass of wine nightly  . Drug use: Yes    Types: Marijuana, Cocaine, Methamphetamines  . Sexual activity: Yes  Other Topics Concern  . None  Social History Narrative  . None     Family History: The patient's family history includes Esophageal cancer in  her maternal grandfather; Heart disease in her mother. There is no history of Colon cancer, Rectal cancer, or Stomach cancer. ROS:   Please see the history of present illness.    All 14 point review of systems negative except as described per history of present illness.  EKGs/Labs/Other Studies Reviewed:    The following studies were reviewed today: Her EKG done by her primary care physician showed normal sinus rhythm, normal P interval, normal QRS complex duration morphology, no ST segment changes Cholesterol profile done just few days ago showed LDL of 140 with HDL of 58.  Her triglycerides were normal  Recent Labs: No results found for requested labs within last 8760 hours.  Recent Lipid Panel No results found for: CHOL, TRIG, HDL, CHOLHDL, VLDL, LDLCALC, LDLDIRECT  Physical Exam:    VS:  BP 124/80 (BP Location: Left Arm, Patient Position: Sitting, Cuff Size: Normal)   Pulse 69   Ht 5' (1.524 m)   Wt 131 lb (59.4 kg)   SpO2 98%   BMI 25.58 kg/m     Wt Readings from Last 3 Encounters:  07/17/17 131 lb (59.4 kg)  02/23/15 138 lb 3.2 oz (62.7 kg)  12/19/14 136 lb (61.7 kg)     GEN:  Well nourished, well developed in no acute distress HEENT: Normal NECK: No JVD; No carotid bruits LYMPHATICS: No lymphadenopathy CARDIAC: RRR, no murmurs, no rubs, no gallops RESPIRATORY:  Clear to auscultation without rales, wheezing but some rhonchi especially in the lower lungs ABDOMEN: Soft, non-tender, non-distended MUSCULOSKELETAL:  No edema; No deformity  SKIN: Warm and dry NEUROLOGIC:  Alert and oriented x 3 PSYCHIATRIC:  Normal affect   ASSESSMENT:    1. History of DVT (deep vein thrombosis)   2. Mixed hyperlipidemia   3. Essential hypertension, benign   4. Claudication in peripheral vascular disease ([place])   5. Peripheral artery disease ([place])   6. History of arterial embolism   7. Smoking    PLAN:    In order of problems listed above:  1. History of DVT in the right lower  extremity with what appears to be paradoxical emboli.  In place and interesting scenario.  My opinion since her DVT appears to be unprovoked 3 hours of fly with some alcohol drinking is probably not sufficient to justify presence of DVT.  She had some family members with DVT actually her grandmother had a DVT and then he ended up having stroke.  In my opinion she needs to be anticoagulated and definitely.  The fact that she is not anticoagulated today we can use this  opportunity and sent some workup for hypercoagulable state.  To have her CBC as well as Chem-7.  We will ask her to have PT/INR.  [place] protein C protein S antiphospholipid antibody tests.  Stool for guaiac will be checked. 2. Claudications: I will ask you to have arterial duplex of lower extremities.  Is already on aspirin which I will continue for now and ask her to initiate statin 3. Dyslipidemia in person with claudication: I will ask you to initiate a statin will go with Lipitor 40 mg. 4. Smoking: Determined to quit smoking she does have boyfriend who does not smoke and he does not want her to smoke she does have Chantix and wants to start taking it which is fine with me 5. History of PFO: Will do echocardiogram to check on that and also check the size of the shunt which I do not think is significant.  Of course this lady with history of paradoxical emboli we may consider closing PFO but I would like to get more information from [place] to see exactly what happened 6. History of intra-atrial septal aneurysm: Echocardiogram will be done   Medication Adjustments/Labs and Tests Ordered: Current medicines are reviewed at length with the patient today.  Concerns regarding medicines are outlined above.  No orders of the defined types were placed in this encounter.  No orders of the defined types were placed in this encounter.   Signed, Park Liter, MD, [facility]. 07/17/2017 3:31 PM    [facility]

## 2017-07-18 DIAGNOSIS — E782 Mixed hyperlipidemia: Secondary | ICD-10-CM | POA: Diagnosis not present

## 2017-07-18 DIAGNOSIS — I739 Peripheral vascular disease, unspecified: Secondary | ICD-10-CM | POA: Diagnosis not present

## 2017-07-18 DIAGNOSIS — Z86718 Personal history of other venous thrombosis and embolism: Secondary | ICD-10-CM | POA: Diagnosis not present

## 2017-07-18 DIAGNOSIS — I10 Essential (primary) hypertension: Secondary | ICD-10-CM | POA: Diagnosis not present

## 2017-07-18 DIAGNOSIS — Z8679 Personal history of other diseases of the circulatory system: Secondary | ICD-10-CM | POA: Diagnosis not present

## 2017-07-24 LAB — FACTOR 5 LEIDEN

## 2017-07-24 LAB — PROTEIN S, TOTAL: Protein S Ag, Total: 138 % (ref 60–150)

## 2017-07-24 LAB — PROTEIN C, TOTAL: Protein C Antigen: 104 % (ref 60–150)

## 2017-07-24 LAB — PROTEIN C ACTIVITY: Protein C Activity: 120 % (ref 73–180)

## 2017-07-24 LAB — PROTEIN S ACTIVITY: PROTEIN S ACTIVITY: 121 % (ref 63–140)

## 2017-07-24 LAB — PROTIME-INR
INR: 1 (ref 0.8–1.2)
Prothrombin Time: 10.4 s (ref 9.1–12.0)

## 2017-08-01 DIAGNOSIS — I10 Essential (primary) hypertension: Secondary | ICD-10-CM | POA: Insufficient documentation

## 2017-08-01 DIAGNOSIS — F1911 Other psychoactive substance abuse, in remission: Secondary | ICD-10-CM | POA: Insufficient documentation

## 2017-08-01 DIAGNOSIS — I739 Peripheral vascular disease, unspecified: Secondary | ICD-10-CM | POA: Insufficient documentation

## 2017-08-06 ENCOUNTER — Ambulatory Visit (HOSPITAL_BASED_OUTPATIENT_CLINIC_OR_DEPARTMENT_OTHER)
Admission: RE | Admit: 2017-08-06 | Discharge: 2017-08-06 | Disposition: A | Discharge status: Home / Self Care | Payer: BLUE CROSS/BLUE SHIELD | Source: Ambulatory Visit | Attending: Cardiology | Admitting: Cardiology

## 2017-08-06 DIAGNOSIS — F17200 Nicotine dependence, unspecified, uncomplicated: Secondary | ICD-10-CM

## 2017-08-06 DIAGNOSIS — Z8679 Personal history of other diseases of the circulatory system: Secondary | ICD-10-CM

## 2017-08-06 DIAGNOSIS — Z86718 Personal history of other venous thrombosis and embolism: Secondary | ICD-10-CM | POA: Insufficient documentation

## 2017-08-06 DIAGNOSIS — E782 Mixed hyperlipidemia: Secondary | ICD-10-CM | POA: Diagnosis not present

## 2017-08-06 DIAGNOSIS — I071 Rheumatic tricuspid insufficiency: Secondary | ICD-10-CM | POA: Diagnosis not present

## 2017-08-06 DIAGNOSIS — I739 Peripheral vascular disease, unspecified: Secondary | ICD-10-CM

## 2017-08-06 DIAGNOSIS — I10 Essential (primary) hypertension: Secondary | ICD-10-CM | POA: Insufficient documentation

## 2017-08-06 DIAGNOSIS — I70203 Unspecified atherosclerosis of native arteries of extremities, bilateral legs: Secondary | ICD-10-CM | POA: Insufficient documentation

## 2017-08-06 DIAGNOSIS — R011 Cardiac murmur, unspecified: Secondary | ICD-10-CM | POA: Diagnosis not present

## 2017-08-06 LAB — VAS US LOWER EXTREMITY ARTERIAL DUPLEX
LEFT SFA DIST DYS VEL: -46 cm/s
LPTIBDISTSYS: -57 cm/s
LSFDPSV: -62 cm/s
Left peroneal sys PSV: -48 cm/s
Left popliteal prox sys PSV: 58 cm/s
Left super femoral mid sys PSV: -93 cm/s
Left super femoral prox sys PSV: -78 cm/s
RATIBDISTSYS: 15 cm/s
RIGHT POST TIB DIST SYS: 43 cm/s
RPOPPPSV: 45 cm/s
RSFDPSV: -70 cm/s
RSFMPSV: -82 cm/s
RSFPPSV: -99 cm/s

## 2017-08-06 NOTE — Progress Notes (Signed)
Echocardiogram 2D Echocardiogram has been performed.  Patricia [name] 08/06/2017, 2:42 PM

## 2017-08-06 NOTE — Progress Notes (Signed)
  Arterial duplex bilateral performed. [place]

## 2017-08-06 NOTE — Progress Notes (Signed)
Echocardiogram 2D Echocardiogram has been performed.  Patricia [name] 08/06/2017, 3:15 PM

## 2017-08-07 ENCOUNTER — Encounter: Payer: Self-pay | Admitting: Cardiology

## 2017-08-07 ENCOUNTER — Ambulatory Visit: Payer: BLUE CROSS/BLUE SHIELD | Admitting: Cardiology

## 2017-08-07 VITALS — BP 130/70 | HR 76 | Resp 12 | Ht 60.0 in | Wt 128.0 lb

## 2017-08-07 DIAGNOSIS — I10 Essential (primary) hypertension: Secondary | ICD-10-CM

## 2017-08-07 DIAGNOSIS — I739 Peripheral vascular disease, unspecified: Secondary | ICD-10-CM | POA: Diagnosis not present

## 2017-08-07 DIAGNOSIS — E782 Mixed hyperlipidemia: Secondary | ICD-10-CM

## 2017-08-07 DIAGNOSIS — Z124 Encounter for screening for malignant neoplasm of cervix: Secondary | ICD-10-CM | POA: Diagnosis not present

## 2017-08-07 DIAGNOSIS — F17200 Nicotine dependence, unspecified, uncomplicated: Secondary | ICD-10-CM | POA: Diagnosis not present

## 2017-08-07 DIAGNOSIS — Z6825 Body mass index (BMI) 25.0-25.9, adult: Secondary | ICD-10-CM | POA: Diagnosis not present

## 2017-08-07 DIAGNOSIS — Z1231 Encounter for screening mammogram for malignant neoplasm of breast: Secondary | ICD-10-CM | POA: Diagnosis not present

## 2017-08-07 DIAGNOSIS — Z01419 Encounter for gynecological examination (general) (routine) without abnormal findings: Secondary | ICD-10-CM | POA: Diagnosis not present

## 2017-08-07 MED ORDER — ROSUVASTATIN CALCIUM 5 MG PO TABS: 5.0000 mg | ORAL_TABLET | Freq: Every day | ORAL | 3 refills | Status: DC

## 2017-08-07 NOTE — Progress Notes (Signed)
Cardiology Office Note:    Date:  08/07/2017   ID:  Patricia [name], DOB [birth year], MRN [id number]  PCP:  Patricia Hatchet, MD  Cardiologist:  Patricia Campus, MD    Referring MD: Patricia Hatchet, MD   Chief Complaint  Patient presents with  . 3 week follow up  She is doing well  History of Present Illness:    Patricia [name] is a 57 y.o. female with a quite interesting story.  Last time I saw her as she told me about her problem with the lack at the fact that she had a blood clot there.  In the meantime I was able to get her records and review it.  It looks like she never had a DVT.  She did have embolic event to the right lower extremities that required embolectomy and then after that she did have actually some procedure done to fix the artery.  She never had a DVT.  As an incidental finding she was find to have a small PFO.  She was told that she will need to have PFO fixed she was also told that she may require anticoagulant until the end of her life.  She was on Coumadin for 6 months and then she discontinued that medication seems to be doing well from that point of view now.  No evidence of any emboli or no evidence of CVA.  She complained of having some pain in both legs actually right appears to be worse than left I did arterial duplex of lower extremity which showed completely occluded to the left ATA.  Interestingly she does not have much symptoms from that side.  We talked at length about what to do with the situation I think the key now will be to modify her risk factors for peripheral vascular disease.  We also talked at length about potentially putting her anticoagulation however indications are not that obvious now.  Still I suspect that what she had was unprovoked event she vaguely remember having some injury to her right lower extremities that eventually she she ended having blood clot.  I did some hypercoagulopathy workup which all came negative.  She is actually in the room with  her mother today and her mother told me that she was tested for this as well and was negative.  Past Medical History:  Diagnosis Date  . Anemia   . Aneurysm ([place])    pt states, "I was told I have an aneurysm in my hear 6, 7 years ago"  . Anxiety   . DVT (deep venous thrombosis) ([place])   . Hypertension   . Peripheral vascular disease [facility])     Past Surgical History:  Procedure Laterality Date  . BREAST SURGERY    . CESAREAN SECTION    . COLONOSCOPY  2006   Patricia [name] in [place]  . COSMETIC SURGERY      Current Medications: Current Meds  Medication Sig  . aspirin 81 MG tablet Take 81 mg by mouth daily.   Marland Kitchen atorvastatin (LIPITOR) 10 MG tablet Take 1 tablet (10 mg total) by mouth daily.  Marland Kitchen escitalopram (LEXAPRO) 20 MG tablet Take 20 mg by mouth daily.  Marland Kitchen lisinopril-hydrochlorothiazide (ZESTORETIC) 10-12.5 MG per tablet Take one tablet (10-125 mg) daily for depression  . varenicline (CHANTIX STARTING MONTH PAK) 0.5 MG X 11 & 1 MG X 42 tablet Take by mouth 2 (two) times daily. Take one 0.5 mg tablet by mouth once daily for 3 days, then  increase to one 0.5 mg tablet twice daily for 4 days, then increase to one 1 mg tablet twice daily.     Allergies:   Codeine   Social History   Socioeconomic History  . Marital status: Single    Spouse name: None  . Number of children: None  . Years of education: None  . Highest education level: None  Social Needs  . Financial resource strain: None  . Food insecurity - worry: None  . Food insecurity - inability: None  . Transportation needs - medical: None  . Transportation needs - non-medical: None  Occupational History  . None  Tobacco Use  . Smoking status: Current Every Day Smoker    Packs/day: 0.50    Years: 30.00    Pack years: 15.00  . Smokeless tobacco: Never Used  Substance and Sexual Activity  . Alcohol use: Yes    Alcohol/week: 12.6 oz    Types: 7 Glasses of wine, 14 Standard drinks or equivalent per week    Comment:  Glass of wine nightly  . Drug use: Yes    Types: Marijuana, Cocaine, Methamphetamines  . Sexual activity: Yes  Other Topics Concern  . None  Social History Narrative  . None     Family History: The patient's family history includes Esophageal cancer in her maternal grandfather; Heart disease in her mother. There is no history of Colon cancer, Rectal cancer, or Stomach cancer. ROS:   Please see the history of present illness.    All 14 point review of systems negative except as described per history of present illness  EKGs/Labs/Other Studies Reviewed:      Recent Labs: No results found for requested labs within last 8760 hours.  Recent Lipid Panel No results found for: CHOL, TRIG, HDL, CHOLHDL, VLDL, LDLCALC, LDLDIRECT  Physical Exam:    VS:  BP 130/70   Pulse 76   Resp 12   Ht 5' (1.524 m)   Wt 128 lb (58.1 kg)   BMI 25.00 kg/m     Wt Readings from Last 3 Encounters:  08/07/17 128 lb (58.1 kg)  07/17/17 131 lb (59.4 kg)  02/23/15 138 lb 3.2 oz (62.7 kg)     GEN:  Well nourished, well developed in no acute distress HEENT: Normal NECK: No JVD; No carotid bruits LYMPHATICS: No lymphadenopathy CARDIAC: RRR, no murmurs, no rubs, no gallops RESPIRATORY:  Clear to auscultation without rales, wheezing or rhonchi  ABDOMEN: Soft, non-tender, non-distended MUSCULOSKELETAL:  No edema; No deformity  SKIN: Warm and dry LOWER EXTREMITIES: no swelling NEUROLOGIC:  Alert and oriented x 3 PSYCHIATRIC:  Normal affect   ASSESSMENT:    1. Peripheral artery disease ([place])   2. Essential hypertension, benign   3. Smoking   4. Mixed hyperlipidemia    PLAN:    In order of problems listed above:  1. Peripheral arterial disease: Completely occluded ATA on the left side.  The key will be risk factors modifications right now she is already on aspirin she is working on quitting smoking she is Chantix and smokes only 2 cigarettes/day.  Goal is obviously to completely discontinue.  I  will need to put her on a statin however because of her history of drinking and some liver issue I will start very gently and carefully.  I put her on 5 mg Crestor daily then 4 weeks later we will check AST ALT as well as to check her lipid panel. 2. Smoking: Works on that trying to quit  it.  Used Chantix seems to be helping 3. Dyslipidemia will start with Crestor 5. 4. Essential hypertension well-controlled will continue present medications   Medication Adjustments/Labs and Tests Ordered: Current medicines are reviewed at length with the patient today.  Concerns regarding medicines are outlined above.  No orders of the defined types were placed in this encounter.  Medication changes: No orders of the defined types were placed in this encounter.   Signed, Park Liter, MD, [facility] 08/07/2017 3:21 PM    [place]

## 2017-08-07 NOTE — Patient Instructions (Signed)
Medication Instructions:  Your physician has recommended you make the following change in your medication:  Start Crestor 5 mg 1 time nightly  Labwork: Your physician recommends that you return for lab work in: 5 weeks for a Lipid panel, AST and ALT  Testing/Procedures: None ordered  Follow-Up: Your physician recommends that you schedule a follow-up appointment in: 2 months with Dr. Agustin Cree   Any Other Special Instructions Will Be Listed Below (If Applicable).     If you need a refill on your cardiac medications before your next appointment, please call your pharmacy.

## 2017-10-06 ENCOUNTER — Ambulatory Visit: Payer: Self-pay | Admitting: Cardiology

## 2018-09-14 DIAGNOSIS — Z6827 Body mass index (BMI) 27.0-27.9, adult: Secondary | ICD-10-CM | POA: Diagnosis not present

## 2018-09-14 DIAGNOSIS — Z01419 Encounter for gynecological examination (general) (routine) without abnormal findings: Secondary | ICD-10-CM | POA: Diagnosis not present

## 2018-09-14 DIAGNOSIS — Z1231 Encounter for screening mammogram for malignant neoplasm of breast: Secondary | ICD-10-CM | POA: Diagnosis not present

## 2019-04-21 DIAGNOSIS — Z0000 Encounter for general adult medical examination without abnormal findings: Secondary | ICD-10-CM | POA: Diagnosis not present

## 2019-04-21 DIAGNOSIS — I10 Essential (primary) hypertension: Secondary | ICD-10-CM | POA: Diagnosis not present

## 2019-04-21 DIAGNOSIS — E7849 Other hyperlipidemia: Secondary | ICD-10-CM | POA: Diagnosis not present

## 2019-04-28 DIAGNOSIS — I10 Essential (primary) hypertension: Secondary | ICD-10-CM | POA: Diagnosis not present

## 2019-04-28 DIAGNOSIS — R7401 Elevation of levels of liver transaminase levels: Secondary | ICD-10-CM | POA: Diagnosis not present

## 2019-04-28 DIAGNOSIS — Z1331 Encounter for screening for depression: Secondary | ICD-10-CM | POA: Diagnosis not present

## 2019-04-28 DIAGNOSIS — G4700 Insomnia, unspecified: Secondary | ICD-10-CM | POA: Diagnosis not present

## 2019-04-28 DIAGNOSIS — R82998 Other abnormal findings in urine: Secondary | ICD-10-CM | POA: Diagnosis not present

## 2019-04-28 DIAGNOSIS — Z0000 Encounter for general adult medical examination without abnormal findings: Secondary | ICD-10-CM | POA: Diagnosis not present

## 2019-04-28 DIAGNOSIS — Z86010 Personal history of colonic polyps: Secondary | ICD-10-CM | POA: Diagnosis not present

## 2019-04-28 DIAGNOSIS — Z23 Encounter for immunization: Secondary | ICD-10-CM | POA: Diagnosis not present

## 2019-04-29 ENCOUNTER — Encounter: Payer: Self-pay | Admitting: Gastroenterology

## 2019-05-03 ENCOUNTER — Other Ambulatory Visit: Payer: Self-pay | Admitting: Internal Medicine

## 2019-05-03 DIAGNOSIS — Z720 Tobacco use: Secondary | ICD-10-CM

## 2019-05-04 ENCOUNTER — Other Ambulatory Visit: Payer: Self-pay | Admitting: Internal Medicine

## 2019-05-05 ENCOUNTER — Other Ambulatory Visit: Payer: Self-pay | Admitting: Internal Medicine

## 2019-05-07 ENCOUNTER — Ambulatory Visit
Admission: RE | Admit: 2019-05-07 | Discharge: 2019-05-07 | Disposition: A | Discharge status: Home / Self Care | Payer: BLUE CROSS/BLUE SHIELD | Source: Ambulatory Visit | Attending: Internal Medicine | Admitting: Internal Medicine

## 2019-05-07 DIAGNOSIS — Z720 Tobacco use: Secondary | ICD-10-CM

## 2019-05-07 DIAGNOSIS — F17210 Nicotine dependence, cigarettes, uncomplicated: Secondary | ICD-10-CM | POA: Diagnosis not present

## 2019-05-10 ENCOUNTER — Other Ambulatory Visit: Payer: Self-pay | Admitting: *Deleted

## 2019-05-10 ENCOUNTER — Encounter: Payer: Self-pay | Admitting: *Deleted

## 2019-05-10 ENCOUNTER — Other Ambulatory Visit: Payer: Self-pay | Admitting: Hematology

## 2019-05-10 DIAGNOSIS — R911 Solitary pulmonary nodule: Secondary | ICD-10-CM | POA: Insufficient documentation

## 2019-05-10 NOTE — Progress Notes (Signed)
Reached out to [place] to introduce myself as the office RN Navigator and explain our new patient process. Reviewed the reason for their referral and scheduled their new patient appointment along with labs. Provided address and directions to the office including call back phone number. Reviewed with patient any concerns they may have or any possible barriers to attending their appointment.   Informed patient about my role as a navigator and that I will meet with them prior to their New Patient appointment and more fully discuss what services I can provide. At this time patient has no further questions or needs.

## 2019-05-10 NOTE — Progress Notes (Signed)
Patricia [name]  Patient Care Team: [name], MD as PCP - General (Internal Medicine)  HEME/ONC OVERVIEW: 1. Suspicious LUL lung nodule -05/2019: 6.61mm LUL nodule w/ spiculated margins and tenting of the adjacent left major fissure on screening CT chest, concerning for primary malignancy (Lung-RADS 4x)  ASSESSMENT & PLAN:   Suspicious LUL lung nodule -I reviewed the patient's records in detail, including PCP clinic notes, lab studies and imaging results -I also independently reviewed the radiologic images of screening CT chest, and agree with the findings as documented -In summary, the patient has an extensive history of tobacco abuse, and underwent screening CT chest in early 05/2019, which showed two scattered pulmonary nodules, the concerning one of which measured ~18mm and located in the posterior left upper lobe with slightly spiculated margins and tenting of the adjacent left major fissure, raising suspicion for primary lung malignancy.  Patient was referred to oncology for further evaluation. -I reviewed the imaging results in detail with the patient, as well as the NCCN guideline -I also discussed the case in detail with thoracic surgery -While the nodule is relatively small and would likely be below PET resolution, thoracic surgery recommended PET to rule out any thoracic LN involvement in consideration for possible surgery -Therefore, I have ordered PET scan, scheduled on 05/19/2019 -In addition, I have ordered PFTs in anticipation of possible surgical resection -If the PET does not show any suspicious LN involvement, she may be a candidate for surgical resection -I have placed referral to thoracic surgery today and will ask the oncology navigator to assist with the scheduling   Transaminitis -Most likely due to EtOH abuse +/-fatty liver disease -ALT 67, mildly elevated; remainder of LFTs normal -See discussion regarding EtOH below -We will monitor  for now  Tobacco abuse -Patient currently smokes ~1ppd -She tried Chanix but developed mood disorder, and therefore stopped taking it  -I spent some time counseling the patient on the importance of smoking cessation; patient is in agreement and will continue to work with her PCP to quit smoking   EtOH abuse -Patient current drinks ~5 drinks/day for the past 40 years -No hx of withdrawal or DT -I counseled the patient on the importance of reducing alcohol intake, with the goal of EtOH abstinence due to elevated LFT's and the risk of permanent liver damage -Patient expressed understanding, and agreed with the plan  Orders Placed This Encounter  Procedures  . Pulmonary Function Test    Standing Status:   Future    Standing Expiration Date:   05/10/2020    Order Specific Question:   Where should this test be performed?    Answer:   [facility]    Order Specific Question:   Full PFT: includes the following: basic spirometry, spirometry pre & post bronchodilator, diffusion capacity (DLCO), lung volumes    Answer:   Full PFT   All questions were answered. The patient knows to call the clinic with any problems, questions or concerns.  Tentatively return in 2 weeks for clinical follow-up.  If the patient is scheduled to proceed with surgery, then we can reschedule the appointment as needed.  Tish Men, MD 05/11/2019 4:15 PM   CHIEF COMPLAINTS/PURPOSE OF CONSULTATION:  "I am here to find out what's next"  HISTORY OF PRESENTING ILLNESS:  Patricia [name] 58 y.o. female is here because of new left upper lobe lung nodule.  Patient has an extensive history of tobacco abuse, and underwent screening CT chest in  early 05/2019, which showed two scattered pulmonary nodules, the concerning one of which measured ~28mm and located in the posterior left upper lobe with slightly spiculated margins and tenting of the adjacent left major fissure, raising suspicion for primary lung malignancy.   Patient was referred to oncology for further evaluation.  Patient reports that she has had chronic, intermittent cough, for the past 20 years, more frequent over the past few months, exacerbated by smoking, especially marijuana.  She denies any sputum production or hemoptysis.  She has occasional shortness of breath with exertion, but otherwise denies any constitutional symptoms, lymphadenopathy, chest pain, or dyspnea of the breast.  She has been smoking at least half a pack to 1 pack/day since age 30, as well as 5 drinks per day.  She does not have any history of alcohol withdrawal or DT.  She smokes marijuana periodically, but denies any other illicit drug use.  She denies any other complaint today.  REVIEW OF SYSTEMS:   Constitutional: ( - ) fevers, ( - )  chills , ( - ) night sweats Eyes: ( - ) blurriness of vision, ( - ) double vision, ( - ) watery eyes Ears, nose, mouth, throat, and face: ( - ) mucositis, ( - ) sore throat Respiratory: ( + ) cough, ( + ) dyspnea, ( - ) wheezes Cardiovascular: ( - ) palpitation, ( - ) chest discomfort, ( - ) lower extremity swelling Gastrointestinal:  ( - ) nausea, ( - ) heartburn, ( - ) change in bowel habits Skin: ( - ) abnormal skin rashes Lymphatics: ( - ) new lymphadenopathy, ( - ) easy bruising Neurological: ( - ) numbness, ( - ) tingling, ( - ) new weaknesses Behavioral/Psych: ( - ) mood change, ( - ) new changes  All other systems were reviewed with the patient and are negative.  I have reviewed her chart and materials related to her cancer extensively and collaborated history with the patient. Summary of oncologic history is as follows: Oncology History   No history exists.    MEDICAL HISTORY:  Past Medical History:  Diagnosis Date  . Anemia   . Aneurysm ([place])    pt states, "I was told I have an aneurysm in my hear 6, 7 years ago"  . Anxiety   . DVT (deep venous thrombosis) ([place])   . Hypertension   . Peripheral vascular disease ([place])      SURGICAL HISTORY: Past Surgical History:  Procedure Laterality Date  . BREAST SURGERY    . CESAREAN SECTION    . COLONOSCOPY  2006   Dr. Nyra Capes in [place]  . COSMETIC SURGERY      SOCIAL HISTORY: Social History   Socioeconomic History  . Marital status: Married    Spouse name: Not on file  . Number of children: Not on file  . Years of education: Not on file  . Highest education level: Not on file  Occupational History  . Not on file  Social Needs  . Financial resource strain: Not on file  . Food insecurity    Worry: Not on file    Inability: Not on file  . Transportation needs    Medical: Not on file    Non-medical: Not on file  Tobacco Use  . Smoking status: Current Every Day Smoker    Packs/day: 0.50    Years: 30.00    Pack years: 15.00  . Smokeless tobacco: Never Used  Substance and Sexual Activity  . Alcohol  use: Yes    Alcohol/week: 21.0 standard drinks    Types: 7 Glasses of wine, 14 Standard drinks or equivalent per week    Comment: Glass of wine nightly  . Drug use: Yes    Types: Marijuana, Cocaine, Methamphetamines  . Sexual activity: Yes  Lifestyle  . Physical activity    Days per week: Not on file    Minutes per session: Not on file  . Stress: Not on file  Relationships  . Social Herbalist on phone: Not on file    Gets together: Not on file    Attends religious service: Not on file    Active member of club or organization: Not on file    Attends meetings of clubs or organizations: Not on file    Relationship status: Not on file  . Intimate partner violence    Fear of current or ex partner: Not on file    Emotionally abused: Not on file    Physically abused: Not on file    Forced sexual activity: Not on file  Other Topics Concern  . Not on file  Social History Narrative  . Not on file    FAMILY HISTORY: Family History  Problem Relation Age of Onset  . Heart disease Mother   . Esophageal cancer Maternal Grandfather    . Colon cancer Neg Hx   . Rectal cancer Neg Hx   . Stomach cancer Neg Hx     ALLERGIES:  is allergic to codeine and latex.  MEDICATIONS:  Current Outpatient Medications  Medication Sig Dispense Refill  . aspirin 81 MG tablet Take 81 mg by mouth daily.     Marland Kitchen escitalopram (LEXAPRO) 20 MG tablet Take 20 mg by mouth daily.    . famotidine (PEPCID) 20 MG tablet Take 20 mg by mouth at bedtime.    . influenza vac split quadrivalent PF (FLUARIX) 0.5 ML injection Fluzone Quad 2018-19(PF) 60 mcg(15 mcgx4)/0.5 mL intramuscular syringe    . lisinopril-hydrochlorothiazide (ZESTORETIC) 10-12.5 MG per tablet Take one tablet (10-125 mg) daily for depression 30 tablet 11  . Prasterone (INTRAROSA) 6.5 MG INST Intrarosa 6.5 mg vaginal insert  INSERT 1 VAGINAL INSERT(S) EVERY DAY BY VAGINAL ROUTE FOR 28 DAYS.    Marland Kitchen rosuvastatin (CRESTOR) 5 MG tablet Take 1 tablet (5 mg total) by mouth daily. 90 tablet 3  . varenicline (CHANTIX STARTING MONTH PAK) 0.5 MG X 11 & 1 MG X 42 tablet Take by mouth 2 (two) times daily. Take one 0.5 mg tablet by mouth once daily for 3 days, then increase to one 0.5 mg tablet twice daily for 4 days, then increase to one 1 mg tablet twice daily.     No current facility-administered medications for this visit.     PHYSICAL EXAMINATION: ECOG PERFORMANCE STATUS: 1 - Symptomatic but completely ambulatory  Vitals:   05/11/19 1400  BP: (!) 150/82  Pulse: 77  Resp: 18  Temp: 98.6 F (37 C)  SpO2: 99%   Filed Weights   05/11/19 1400  Weight: 150 lb 6.4 oz (68.2 kg)    GENERAL: alert, no distress and comfortable SKIN: skin color, texture, turgor are normal, no rashes or significant lesions EYES: conjunctiva are pink and non-injected, sclera clear OROPHARYNX: no exudate, no erythema; lips, buccal mucosa, and tongue normal  NECK: supple, non-tender LYMPH:  no palpable lymphadenopathy in the cervical LUNGS: clear to auscultation with normal breathing effort HEART: regular rate  & rhythm, no murmurs, no lower  extremity edema ABDOMEN: soft, non-tender, non-distended, normal bowel sounds Musculoskeletal: no cyanosis of digits and no clubbing  PSYCH: alert & oriented x 3, fluent speech  LABORATORY DATA:  I have reviewed the data as listed Lab Results  Component Value Date   WBC 10.0 05/11/2019   HGB 13.9 05/11/2019   HCT 41.0 05/11/2019   MCV 94.5 05/11/2019   PLT 255 05/11/2019   Lab Results  Component Value Date   NA 140 05/11/2019   K 3.8 05/11/2019   CL 99 05/11/2019   CO2 29 05/11/2019    RADIOGRAPHIC STUDIES: I have personally reviewed the radiological images as listed and agreed with the findings in the report. Ct Chest Lung Ca Screen Low Dose W/o Cm  Result Date: 05/07/2019 CLINICAL DATA:  58 year old asymptomatic female current smoker with 41 pack-year smoking history. EXAM: CT CHEST WITHOUT CONTRAST LOW-DOSE FOR LUNG CANCER SCREENING TECHNIQUE: Multidetector CT imaging of the chest was performed following the standard protocol without IV contrast. COMPARISON:  None. FINDINGS: Cardiovascular: Normal heart size. No significant pericardial effusion/thickening. Mildly atherosclerotic nonaneurysmal thoracic aorta. Normal caliber pulmonary arteries. Mediastinum/Nodes: No discrete thyroid nodules. Unremarkable esophagus. No pathologically enlarged axillary, mediastinal or hilar lymph nodes, noting limited sensitivity for the detection of hilar adenopathy on this noncontrast study. Lungs/Pleura: No pneumothorax. No pleural effusion. Mild centrilobular emphysema. No acute consolidative airspace disease or lung masses. There are 2 scattered pulmonary nodules, the largest of which in the posterior left upper lobe measures 6.3 mm in volume derived mean diameter (series 9/image 116) and demonstrates aggressive features including slightly spiculated margins and tenting of the adjacent left major fissure. Upper abdomen: No acute abnormality. Musculoskeletal: No aggressive  appearing focal osseous lesions. Minimal thoracic spondylosis. IMPRESSION: Lung-RADS 4X, highly suspicious. 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 Additional imaging evaluation or consultation with Pulmonology or Thoracic Surgery recommended. Initial follow up low-dose chest CT without contrast in 3 months (please use the following order, "CT CHEST LCS NODULE FOLLOW-UP W/O CM") is recommended at a minimum. This nodule is below PET resolution. Aortic Atherosclerosis (ICD10-I70.0) and Emphysema (ICD10-J43.9). These results will be called to the ordering clinician or representative by the Radiologist Assistant, and communication documented in the PACS or zVision Dashboard. Electronically Signed   By: Ilona Sorrel M.D.   On: 05/07/2019 16:11    PATHOLOGY: I have reviewed the pathology reports as documented in the oncologist history.

## 2019-05-11 ENCOUNTER — Inpatient Hospital Stay: Payer: BC Managed Care – PPO

## 2019-05-11 ENCOUNTER — Other Ambulatory Visit: Payer: Self-pay | Admitting: Hematology

## 2019-05-11 ENCOUNTER — Encounter: Payer: Self-pay | Admitting: Hematology

## 2019-05-11 ENCOUNTER — Encounter: Payer: Self-pay | Admitting: *Deleted

## 2019-05-11 ENCOUNTER — Inpatient Hospital Stay: Payer: BC Managed Care – PPO | Attending: Hematology | Admitting: Hematology

## 2019-05-11 ENCOUNTER — Other Ambulatory Visit: Payer: Self-pay

## 2019-05-11 VITALS — BP 150/82 | HR 77 | Temp 98.6°F | Resp 18 | Ht 60.0 in | Wt 150.4 lb

## 2019-05-11 DIAGNOSIS — F1010 Alcohol abuse, uncomplicated: Secondary | ICD-10-CM | POA: Diagnosis not present

## 2019-05-11 DIAGNOSIS — Z86718 Personal history of other venous thrombosis and embolism: Secondary | ICD-10-CM | POA: Diagnosis not present

## 2019-05-11 DIAGNOSIS — Z720 Tobacco use: Secondary | ICD-10-CM

## 2019-05-11 DIAGNOSIS — R7401 Elevation of levels of liver transaminase levels: Secondary | ICD-10-CM

## 2019-05-11 DIAGNOSIS — I10 Essential (primary) hypertension: Secondary | ICD-10-CM

## 2019-05-11 DIAGNOSIS — Z79899 Other long term (current) drug therapy: Secondary | ICD-10-CM | POA: Insufficient documentation

## 2019-05-11 DIAGNOSIS — R918 Other nonspecific abnormal finding of lung field: Secondary | ICD-10-CM | POA: Diagnosis not present

## 2019-05-11 DIAGNOSIS — R911 Solitary pulmonary nodule: Secondary | ICD-10-CM

## 2019-05-11 DIAGNOSIS — F17210 Nicotine dependence, cigarettes, uncomplicated: Secondary | ICD-10-CM | POA: Insufficient documentation

## 2019-05-11 LAB — CBC WITH DIFFERENTIAL (CANCER CENTER ONLY)
Abs Immature Granulocytes: 0.03 10*3/uL (ref 0.00–0.07)
Basophils Absolute: 0.1 10*3/uL (ref 0.0–0.1)
Basophils Relative: 1 %
Eosinophils Absolute: 0.2 10*3/uL (ref 0.0–0.5)
Eosinophils Relative: 2 %
HCT: 41 % (ref 36.0–46.0)
Hemoglobin: 13.9 g/dL (ref 12.0–15.0)
Immature Granulocytes: 0 %
Lymphocytes Relative: 25 %
Lymphs Abs: 2.5 10*3/uL (ref 0.7–4.0)
MCH: 32 pg (ref 26.0–34.0)
MCHC: 33.9 g/dL (ref 30.0–36.0)
MCV: 94.5 fL (ref 80.0–100.0)
Monocytes Absolute: 1 10*3/uL (ref 0.1–1.0)
Monocytes Relative: 10 %
Neutro Abs: 6.1 10*3/uL (ref 1.7–7.7)
Neutrophils Relative %: 62 %
Platelet Count: 255 10*3/uL (ref 150–400)
RBC: 4.34 MIL/uL (ref 3.87–5.11)
RDW: 13 % (ref 11.5–15.5)
WBC Count: 10 10*3/uL (ref 4.0–10.5)
nRBC: 0 % (ref 0.0–0.2)

## 2019-05-11 LAB — CMP (CANCER CENTER ONLY)
ALT: 67 U/L — ABNORMAL HIGH (ref 0–44)
AST: 39 U/L (ref 15–41)
Albumin: 4.7 g/dL (ref 3.5–5.0)
Alkaline Phosphatase: 71 U/L (ref 38–126)
Anion gap: 12 (ref 5–15)
BUN: 16 mg/dL (ref 6–20)
CO2: 29 mmol/L (ref 22–32)
Calcium: 9.8 mg/dL (ref 8.9–10.3)
Chloride: 99 mmol/L (ref 98–111)
Creatinine: 0.85 mg/dL (ref 0.44–1.00)
GFR, Est AFR Am: >60 mL/min (ref >60)
GFR, Estimated: >60 mL/min (ref >60)
Glucose, Bld: 110 mg/dL — ABNORMAL HIGH (ref 70–99)
Potassium: 3.8 mmol/L (ref 3.5–5.1)
Sodium: 140 mmol/L (ref 135–145)
Total Bilirubin: 0.4 mg/dL (ref 0.3–1.2)
Total Protein: 7.3 g/dL (ref 6.5–8.1)

## 2019-05-11 NOTE — Progress Notes (Signed)
Initial RN Navigator Patient Visit  Name: Clariece Roesler Hartnett Date of Referral : 05/10/2019 Diagnosis: Lung Nodule  Met with patient prior to their visit with MD. Hanley Seamen patient "Your Patient Navigator" handout which explains my role, areas in which I am able to help, and all the contact information for myself and the office. Also gave patient MD and Navigator business card. Reviewed with patient the general overview of expected course after initial diagnosis and time frame for all steps to be completed.  New patient packet given to patient which includes: orientation to office and staff; campus directory; education on My Chart and Advance Directives; and patient centered education on Lung Cancer  Patient completed visit with Dr. Maylon Peppers  Revisited with patient after MD visit. Patient will need  PET Scan - scheduled 05/19/2019. Scan education reviewed and printed for patient. Referral to [facility] - appointment scheduled for 05/20/2019.  PFT - message left on scheduling line. Awaiting call back. Will follow  The following appointments were made while patient was still here in the office. Calendar was reviewed and given to patient.Address and phone numbers of [facility] locations, referring MD offices, surgical procedures which have been scheduled have been provided.   Patient understands all follow up procedures and expectations. They have my number to reach out for any further clarification or additional needs.

## 2019-05-12 ENCOUNTER — Encounter: Payer: Self-pay | Admitting: *Deleted

## 2019-05-12 ENCOUNTER — Other Ambulatory Visit: Payer: Self-pay

## 2019-05-12 NOTE — Progress Notes (Signed)
Oncology Nurse Navigator Documentation  Oncology Nurse Navigator Flowsheets 05/12/2019  Abnormal Finding Date -  Diagnosis Status -  Navigator Follow Up Date: -  Navigator Follow Up Reason: -  Navigator Location [facility]  Referral Date to RadOnc/MedOnc -  Navigator Encounter Type Other/I received a call from referring office with a need for an update on patient appts.  I called and updated her that patient was seen with med onc and is set up for PET scan and T surgery.  She was thankful for the call.   Patient Visit Type -  Treatment Phase Abnormal Scans  Barriers/Navigation Needs Coordination of Care  Education -  Interventions Coordination of Care  Acuity Level 2-Minimal Needs (1-2 Barriers Identified)  Referrals -  Coordination of Care Other  Education Method -  Time Spent with Patient 30

## 2019-05-14 ENCOUNTER — Encounter: Payer: Self-pay | Admitting: *Deleted

## 2019-05-14 DIAGNOSIS — Z1159 Encounter for screening for other viral diseases: Secondary | ICD-10-CM

## 2019-05-14 NOTE — Progress Notes (Signed)
Patient PFTs not yet scheduled. Call made and patient scheduled for PFTs on 05/21/2019. Informed that patient will need a COVID screening test 3 days before her procedure.   Lab order placed, and patient scheduled for COVID screen on 05/18/2019.  Called patient and reviewed all education with her.  COVID screen - she must quarentine after the exam only going to medical procedures and appointments. She must use masks at all times when out of her house and use good handwashing.  PFTs - 4 hours prior to the PFTs she must not smoke, use any inhalers, or consume and caffiene.   She understands. She knows she can call me with any questions or concerns.

## 2019-05-18 ENCOUNTER — Other Ambulatory Visit (HOSPITAL_COMMUNITY)
Admission: RE | Admit: 2019-05-18 | Discharge: 2019-05-18 | Disposition: A | Discharge status: Home / Self Care | Payer: BC Managed Care – PPO | Source: Ambulatory Visit | Attending: Hematology | Admitting: Hematology

## 2019-05-18 DIAGNOSIS — Z01812 Encounter for preprocedural laboratory examination: Secondary | ICD-10-CM | POA: Diagnosis not present

## 2019-05-18 DIAGNOSIS — Z20828 Contact with and (suspected) exposure to other viral communicable diseases: Secondary | ICD-10-CM | POA: Diagnosis not present

## 2019-05-18 LAB — SARS CORONAVIRUS 2 (TAT 6-24 HRS): SARS Coronavirus 2: NEGATIVE

## 2019-05-19 ENCOUNTER — Ambulatory Visit (HOSPITAL_COMMUNITY)
Admission: RE | Admit: 2019-05-19 | Discharge: 2019-05-19 | Disposition: A | Discharge status: Home / Self Care | Payer: BC Managed Care – PPO | Source: Ambulatory Visit | Attending: Hematology | Admitting: Hematology

## 2019-05-19 ENCOUNTER — Other Ambulatory Visit: Payer: Self-pay

## 2019-05-19 ENCOUNTER — Encounter: Payer: Self-pay | Admitting: *Deleted

## 2019-05-19 DIAGNOSIS — R918 Other nonspecific abnormal finding of lung field: Secondary | ICD-10-CM | POA: Insufficient documentation

## 2019-05-19 DIAGNOSIS — R911 Solitary pulmonary nodule: Secondary | ICD-10-CM | POA: Diagnosis not present

## 2019-05-19 LAB — GLUCOSE, CAPILLARY: Glucose-Capillary: 120 mg/dL — ABNORMAL HIGH (ref 70–99)

## 2019-05-19 MED ORDER — FLUDEOXYGLUCOSE F - 18 (FDG) INJECTION
7.1600 | Freq: Once | INTRAVENOUS | Status: AC | PRN
  Administered 2019-05-19: 07:00:00 7.16 via INTRAVENOUS

## 2019-05-19 NOTE — Progress Notes (Signed)
Patient called requesting results of her COVID test and PET scan. Okayed with Dr Maylon Peppers. Results given to patient.

## 2019-05-20 ENCOUNTER — Encounter: Payer: BC Managed Care – PPO | Admitting: Thoracic Surgery (Cardiothoracic Vascular Surgery)

## 2019-05-21 ENCOUNTER — Ambulatory Visit (HOSPITAL_COMMUNITY)
Admission: RE | Admit: 2019-05-21 | Discharge: 2019-05-21 | Disposition: A | Discharge status: Home / Self Care | Payer: BC Managed Care – PPO | Source: Ambulatory Visit | Attending: Hematology | Admitting: Hematology

## 2019-05-21 ENCOUNTER — Other Ambulatory Visit: Payer: Self-pay

## 2019-05-21 DIAGNOSIS — R911 Solitary pulmonary nodule: Secondary | ICD-10-CM | POA: Diagnosis not present

## 2019-05-21 LAB — PULMONARY FUNCTION TEST
DL/VA % pred: 102 %
DL/VA: 4.49 ml/min/mmHg/L
DLCO unc % pred: 98 %
DLCO unc: 17.61 ml/min/mmHg
FEF 25-75 Post: 1.16 L/sec
FEF 25-75 Pre: 1.66 L/sec
FEF2575-%Change-Post: -30 %
FEF2575-%Pred-Post: 50 %
FEF2575-%Pred-Pre: 72 %
FEV1-%Change-Post: -8 %
FEV1-%Pred-Post: 78 %
FEV1-%Pred-Pre: 85 %
FEV1-Post: 1.8 L
FEV1-Pre: 1.95 L
FEV1FVC-%Change-Post: -3 %
FEV1FVC-%Pred-Pre: 98 %
FEV6-%Change-Post: -5 %
FEV6-%Pred-Post: 84 %
FEV6-%Pred-Pre: 88 %
FEV6-Post: 2.39 L
FEV6-Pre: 2.52 L
FEV6FVC-%Pred-Post: 103 %
FEV6FVC-%Pred-Pre: 103 %
FVC-%Change-Post: -5 %
FVC-%Pred-Post: 81 %
FVC-%Pred-Pre: 85 %
FVC-Post: 2.39 L
FVC-Pre: 2.52 L
Post FEV1/FVC ratio: 75 %
Post FEV6/FVC ratio: 100 %
Pre FEV1/FVC ratio: 78 %
Pre FEV6/FVC Ratio: 100 %
RV % pred: 79 %
RV: 1.36 L
TLC % pred: 94 %
TLC: 4.22 L

## 2019-05-21 MED ORDER — ALBUTEROL SULFATE (2.5 MG/3ML) 0.083% IN NEBU
2.5000 mg | INHALATION_SOLUTION | Freq: Once | RESPIRATORY_TRACT | Status: AC
  Administered 2019-05-21: 2.5 mg via RESPIRATORY_TRACT

## 2019-05-24 ENCOUNTER — Encounter: Payer: BC Managed Care – PPO | Admitting: Thoracic Surgery (Cardiothoracic Vascular Surgery)

## 2019-05-25 ENCOUNTER — Encounter: Payer: Self-pay | Admitting: Thoracic Surgery (Cardiothoracic Vascular Surgery)

## 2019-05-25 ENCOUNTER — Other Ambulatory Visit: Payer: Self-pay

## 2019-05-25 ENCOUNTER — Other Ambulatory Visit: Payer: Self-pay | Admitting: *Deleted

## 2019-05-25 ENCOUNTER — Institutional Professional Consult (permissible substitution): Payer: BC Managed Care – PPO | Admitting: Thoracic Surgery (Cardiothoracic Vascular Surgery)

## 2019-05-25 ENCOUNTER — Inpatient Hospital Stay: Payer: BC Managed Care – PPO | Admitting: Hematology

## 2019-05-25 VITALS — BP 155/100 | HR 95 | Temp 98.1°F | Resp 20 | Ht 60.0 in | Wt 143.0 lb

## 2019-05-25 DIAGNOSIS — R911 Solitary pulmonary nodule: Secondary | ICD-10-CM

## 2019-05-25 NOTE — Progress Notes (Signed)
PCP is [name], MD Referring Provider is Tish Men, MD  Chief Complaint  Patient presents with  . Lung Lesion    Surgical eval, Chest CT 05/07/19, PET Scan 05/19/19, PFT's 05/21/19    HPI: Patricia [name] is sent for consultation regarding a left upper lobe lung nodule.  Patricia [name] is a 58 year old woman with a past medical history significant for tobacco abuse, ethanol dependence, major depressive disorder, history of peripheral arterial embolic disease, possible PFO, anxiety, and anemia.  She smokes about a pack or less of cigarettes a day for the past 41 years.  She continues to smoke about 5 to 10 cigarettes daily.  She has been trying to cut down.  She recently saw Dr. Ardeth Perfect for chronic cough.  He recommended a low-dose screening CT.  That showed a 6 to 7 mm nodule in the left upper lobe along the major fissure.  She was referred to Dr. Maylon Peppers.  PET/CT showed no uptake but more importantly showed no evidence of regional or distant metastatic disease.  Other than the cough she has been feeling well.  She does have some residual claudication symptoms in the right calf related to her arterial embolization.  She is somewhat limited by that.  She is not limited by shortness of breath.  She denies any chest pain, pressure, or tightness at rest or with exertion.  Her appetite is good.  She denies weight loss. Zubrod Score: At the time of surgery this patient's most appropriate activity status/level should be described as: [x]     0    Normal activity, no symptoms []     1    Restricted in physical strenuous activity but ambulatory, able to do out light work []     2    Ambulatory and capable of self care, unable to do work activities, up and about >50 % of waking hours                              []     3    Only limited self care, in bed greater than 50% of waking hours []     4    Completely disabled, no self care, confined to bed or chair []     5    Moribund   Past Medical  History:  Diagnosis Date  . Anemia   . Aneurysm ([place])    pt states, "I was told I have an aneurysm in my hear 6, 7 years ago"  . Anxiety   . DVT (deep venous thrombosis) ([place])   . Hypertension   . Peripheral vascular disease [facility])     Past Surgical History:  Procedure Laterality Date  . BREAST SURGERY    . CESAREAN SECTION    . COLONOSCOPY  2006   Dr. Nyra Capes in [place]  . COSMETIC SURGERY      Family History  Problem Relation Age of Onset  . Heart disease Mother   . Esophageal cancer Maternal Grandfather   . Colon cancer Neg Hx   . Rectal cancer Neg Hx   . Stomach cancer Neg Hx     Social History Social History   Tobacco Use  . Smoking status: Current Every Day Smoker    Packs/day: 0.50    Years: 30.00    Pack years: 15.00  . Smokeless tobacco: Never Used  Substance Use Topics  . Alcohol use: Yes    Alcohol/week: 21.0 standard drinks  Types: 7 Glasses of wine, 14 Standard drinks or equivalent per week    Comment: Glass of wine nightly  . Drug use: Yes    Types: Marijuana, Cocaine, Methamphetamines    Current Outpatient Medications  Medication Sig Dispense Refill  . escitalopram (LEXAPRO) 20 MG tablet Take 20 mg by mouth daily.    . famotidine (PEPCID) 20 MG tablet Take 20 mg by mouth at bedtime.    . influenza vac split quadrivalent PF (FLUARIX) 0.5 ML injection Fluzone Quad 2018-19(PF) 60 mcg(15 mcgx4)/0.5 mL intramuscular syringe    . lisinopril-hydrochlorothiazide (ZESTORETIC) 10-12.5 MG per tablet Take one tablet (10-125 mg) daily for depression 30 tablet 11  . aspirin 81 MG tablet Take 81 mg by mouth daily.     . varenicline (CHANTIX STARTING MONTH PAK) 0.5 MG X 11 & 1 MG X 42 tablet Take by mouth 2 (two) times daily. Take one 0.5 mg tablet by mouth once daily for 3 days, then increase to one 0.5 mg tablet twice daily for 4 days, then increase to one 1 mg tablet twice daily.     No current facility-administered medications for this visit.      Allergies  Allergen Reactions  . Codeine Shortness Of Breath  . Latex     Review of Systems  Constitutional: Negative for activity change, appetite change and unexpected weight change.  HENT: Negative for trouble swallowing and voice change.   Eyes: Negative for visual disturbance.  Respiratory: Positive for cough. Negative for shortness of breath and wheezing.   Cardiovascular: Negative for chest pain, palpitations and leg swelling.       Claudication right calf  Gastrointestinal: Negative for abdominal pain and blood in stool.  Neurological: Positive for dizziness and syncope (Presyncope only).  Hematological: Negative for adenopathy. Does not bruise/bleed easily.  Psychiatric/Behavioral: The patient is nervous/anxious.   All other systems reviewed and are negative.   BP (!) 155/100   Pulse 95   Temp 98.1 F (36.7 C)   Resp 20   Ht 5' (1.524 m)   Wt 143 lb (64.9 kg)   SpO2 94% Comment: RA  BMI 27.93 kg/m  Physical Exam Vitals signs reviewed.  Constitutional:      General: She is not in acute distress.    Appearance: Normal appearance.  HENT:     Head: Normocephalic and atraumatic.  Eyes:     General: No scleral icterus.    Extraocular Movements: Extraocular movements intact.  Neck:     Musculoskeletal: Neck supple.     Vascular: No carotid bruit.  Cardiovascular:     Rate and Rhythm: Normal rate and regular rhythm.     Heart sounds: Normal heart sounds. No murmur. No friction rub. No gallop.   Pulmonary:     Effort: Pulmonary effort is normal. No respiratory distress.     Breath sounds: Normal breath sounds. No wheezing or rales.  Abdominal:     General: There is no distension.     Palpations: Abdomen is soft.     Tenderness: There is no abdominal tenderness.  Musculoskeletal:        General: No swelling.  Lymphadenopathy:     Cervical: No cervical adenopathy.  Skin:    General: Skin is warm and dry.  Neurological:     General: No focal deficit  present.     Mental Status: She is alert and oriented to person, place, and time.     Cranial Nerves: No cranial nerve deficit.  Motor: No weakness.    Diagnostic Tests: CT CHEST WITHOUT CONTRAST LOW-DOSE FOR LUNG CANCER SCREENING  TECHNIQUE: Multidetector CT imaging of the chest was performed following the standard protocol without IV contrast.  COMPARISON:  None.  FINDINGS: Cardiovascular: Normal heart size. No significant pericardial effusion/thickening. Mildly atherosclerotic nonaneurysmal thoracic aorta. Normal caliber pulmonary arteries.  Mediastinum/Nodes: No discrete thyroid nodules. Unremarkable esophagus. No pathologically enlarged axillary, mediastinal or hilar lymph nodes, noting limited sensitivity for the detection of hilar adenopathy on this noncontrast study.  Lungs/Pleura: No pneumothorax. No pleural effusion. Mild centrilobular emphysema. No acute consolidative airspace disease or lung masses. There are 2 scattered pulmonary nodules, the largest of which in the posterior left upper lobe measures 6.3 mm in volume derived mean diameter (series 9/image 116) and demonstrates aggressive features including slightly spiculated margins and tenting of the adjacent left major fissure.  Upper abdomen: No acute abnormality.  Musculoskeletal: No aggressive appearing focal osseous lesions. Minimal thoracic spondylosis.  IMPRESSION: Lung-RADS 4X, highly suspicious. 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 Additional imaging evaluation or consultation with Pulmonology or Thoracic Surgery recommended. Initial follow up low-dose chest CT without contrast in 3 months (please use the following order, "CT CHEST LCS NODULE FOLLOW-UP W/O CM") is recommended at a minimum. This nodule is below PET resolution.  Aortic Atherosclerosis  (ICD10-I70.0) and Emphysema (ICD10-J43.9).  These results will be called to the ordering clinician or representative by the Radiologist Assistant, and communication documented in the PACS or zVision Dashboard.   Electronically Signed   By: Ilona Sorrel M.D.   On: 05/07/2019 16:11 I personally reviewed the CT images and concur with the findings noted above.  I also reviewed the PET/CT images which showed no evidence of regional or distant metastatic disease.  The left upper lobe nodule was too small to characterize.  Pulmonary function testing FVC 2.52 (85%) FEV1 1.95 (85%) FEV1 1.80 (78%) postbronchodilator DLCO 17.61 (98%)  Impression: Patricia [name] is a 58 year old woman with a history of tobacco abuse, ethanol abuse, anxiety, depression, COPD, peripheral arterial embolization and a possible PFO (although not demonstrated on most recent echo in our system).  She recently saw Dr. Ardeth Perfect regarding a cough.  Given her smoking history a low-dose screening CT was done.  It demonstrated a 7 mm spiculated nodule in the left upper lobe adjacent to the major fissure with tenting of the fissure.  This was felt by radiology to be high risk lesion with a high likelihood of being a non-small cell carcinoma.  There was no hilar or mediastinal adenopathy.  On PET CT the nodule is too small to characterize but there was no evidence of regional or distant metastatic disease.  I reviewed the images with Mrs. Jiron and her husband.  We discussed the differential diagnosis which includes non-small cell carcinoma, granulomatous disease, as well as some other infectious or inflammatory nodules.  We did discuss the radiographic features which make this concerning including spiculation and tenting of the major fissure.  We discussed potential options for diagnosis and treatment.  We discussed radiographic follow-up, bronchoscopic biopsy, CT-guided biopsy, or VATS for wedge resection for  definitive diagnosis and treatment with either segmentectomy or lobectomy at the same setting.  We discussed the relative advantages and disadvantages of each of those approaches.  The lesion is not amenable to CT-guided biopsy.  Bronchoscopic biopsy would be a very low yield and  relatively high risk procedure given the small size and proximity to the fissure.  That really leaves [place] with radiographic follow-up or proceeding for surgical resection.  She very strongly favors an aggressive approach.  Her mother died of lung cancer after initially having been found to have a small nodule.  She would like to have the nodule removed even if it turns out to be benign.  I discussed the proposed operation of left VATS for wedge resection followed by segmentectomy or possible lobectomy although I do not think that will be necessary.  We reviewed the general nature of the procedure including the need for general anesthesia, the incision to be used, the use of drains to postoperatively, the expected hospital stay, and the overall recovery.  We discussed the indications, risks, benefits, and alternatives.  She understands the risks include, but not limited to death, MI, DVT PE, bleeding, possible need for transfusion, infection, stroke, prolonged air leak, cardiac arrhythmias, as well as possibility of other unstable complications.  She understands accepts the risks and wishes to proceed with surgery  Plan: Left VATS for wedge resection and possible segmentectomy or lobectomy  [name], MD [facility] [phone]

## 2019-05-25 NOTE — H&P (View-Only) (Signed)
PCP is [name], MD Referring Provider is Tish Men, MD  Chief Complaint  Patient presents with  . Lung Lesion    Surgical eval, Chest CT 05/07/19, PET Scan 05/19/19, PFT's 05/21/19    HPI: Patricia [name] is sent for consultation regarding a left upper lobe lung nodule.  Patricia [name] is a 58 year old woman with a past medical history significant for tobacco abuse, ethanol dependence, major depressive disorder, history of peripheral arterial embolic disease, possible PFO, anxiety, and anemia.  She smokes about a pack or less of cigarettes a day for the past 41 years.  She continues to smoke about 5 to 10 cigarettes daily.  She has been trying to cut down.  She recently saw Dr. Ardeth Perfect for chronic cough.  He recommended a low-dose screening CT.  That showed a 6 to 7 mm nodule in the left upper lobe along the major fissure.  She was referred to Dr. Maylon Peppers.  PET/CT showed no uptake but more importantly showed no evidence of regional or distant metastatic disease.  Other than the cough she has been feeling well.  She does have some residual claudication symptoms in the right calf related to her arterial embolization.  She is somewhat limited by that.  She is not limited by shortness of breath.  She denies any chest pain, pressure, or tightness at rest or with exertion.  Her appetite is good.  She denies weight loss. Zubrod Score: At the time of surgery this patient's most appropriate activity status/level should be described as: [x]     0    Normal activity, no symptoms []     1    Restricted in physical strenuous activity but ambulatory, able to do out light work []     2    Ambulatory and capable of self care, unable to do work activities, up and about >50 % of waking hours                              []     3    Only limited self care, in bed greater than 50% of waking hours []     4    Completely disabled, no self care, confined to bed or chair []     5    Moribund   Past Medical  History:  Diagnosis Date  . Anemia   . Aneurysm ([place])    pt states, "I was told I have an aneurysm in my hear 6, 7 years ago"  . Anxiety   . DVT (deep venous thrombosis) ([place])   . Hypertension   . Peripheral vascular disease [facility])     Past Surgical History:  Procedure Laterality Date  . BREAST SURGERY    . CESAREAN SECTION    . COLONOSCOPY  2006   Dr. Nyra Capes in [place]  . COSMETIC SURGERY      Family History  Problem Relation Age of Onset  . Heart disease Mother   . Esophageal cancer Maternal Grandfather   . Colon cancer Neg Hx   . Rectal cancer Neg Hx   . Stomach cancer Neg Hx     Social History Social History   Tobacco Use  . Smoking status: Current Every Day Smoker    Packs/day: 0.50    Years: 30.00    Pack years: 15.00  . Smokeless tobacco: Never Used  Substance Use Topics  . Alcohol use: Yes    Alcohol/week: 21.0 standard drinks  Types: 7 Glasses of wine, 14 Standard drinks or equivalent per week    Comment: Glass of wine nightly  . Drug use: Yes    Types: Marijuana, Cocaine, Methamphetamines    Current Outpatient Medications  Medication Sig Dispense Refill  . escitalopram (LEXAPRO) 20 MG tablet Take 20 mg by mouth daily.    . famotidine (PEPCID) 20 MG tablet Take 20 mg by mouth at bedtime.    . influenza vac split quadrivalent PF (FLUARIX) 0.5 ML injection Fluzone Quad 2018-19(PF) 60 mcg(15 mcgx4)/0.5 mL intramuscular syringe    . lisinopril-hydrochlorothiazide (ZESTORETIC) 10-12.5 MG per tablet Take one tablet (10-125 mg) daily for depression 30 tablet 11  . aspirin 81 MG tablet Take 81 mg by mouth daily.     . varenicline (CHANTIX STARTING MONTH PAK) 0.5 MG X 11 & 1 MG X 42 tablet Take by mouth 2 (two) times daily. Take one 0.5 mg tablet by mouth once daily for 3 days, then increase to one 0.5 mg tablet twice daily for 4 days, then increase to one 1 mg tablet twice daily.     No current facility-administered medications for this visit.      Allergies  Allergen Reactions  . Codeine Shortness Of Breath  . Latex     Review of Systems  Constitutional: Negative for activity change, appetite change and unexpected weight change.  HENT: Negative for trouble swallowing and voice change.   Eyes: Negative for visual disturbance.  Respiratory: Positive for cough. Negative for shortness of breath and wheezing.   Cardiovascular: Negative for chest pain, palpitations and leg swelling.       Claudication right calf  Gastrointestinal: Negative for abdominal pain and blood in stool.  Neurological: Positive for dizziness and syncope (Presyncope only).  Hematological: Negative for adenopathy. Does not bruise/bleed easily.  Psychiatric/Behavioral: The patient is nervous/anxious.   All other systems reviewed and are negative.   BP (!) 155/100   Pulse 95   Temp 98.1 F (36.7 C)   Resp 20   Ht 5' (1.524 m)   Wt 143 lb (64.9 kg)   SpO2 94% Comment: RA  BMI 27.93 kg/m  Physical Exam Vitals signs reviewed.  Constitutional:      General: She is not in acute distress.    Appearance: Normal appearance.  HENT:     Head: Normocephalic and atraumatic.  Eyes:     General: No scleral icterus.    Extraocular Movements: Extraocular movements intact.  Neck:     Musculoskeletal: Neck supple.     Vascular: No carotid bruit.  Cardiovascular:     Rate and Rhythm: Normal rate and regular rhythm.     Heart sounds: Normal heart sounds. No murmur. No friction rub. No gallop.   Pulmonary:     Effort: Pulmonary effort is normal. No respiratory distress.     Breath sounds: Normal breath sounds. No wheezing or rales.  Abdominal:     General: There is no distension.     Palpations: Abdomen is soft.     Tenderness: There is no abdominal tenderness.  Musculoskeletal:        General: No swelling.  Lymphadenopathy:     Cervical: No cervical adenopathy.  Skin:    General: Skin is warm and dry.  Neurological:     General: No focal deficit  present.     Mental Status: She is alert and oriented to person, place, and time.     Cranial Nerves: No cranial nerve deficit.  Motor: No weakness.    Diagnostic Tests: CT CHEST WITHOUT CONTRAST LOW-DOSE FOR LUNG CANCER SCREENING  TECHNIQUE: Multidetector CT imaging of the chest was performed following the standard protocol without IV contrast.  COMPARISON:  None.  FINDINGS: Cardiovascular: Normal heart size. No significant pericardial effusion/thickening. Mildly atherosclerotic nonaneurysmal thoracic aorta. Normal caliber pulmonary arteries.  Mediastinum/Nodes: No discrete thyroid nodules. Unremarkable esophagus. No pathologically enlarged axillary, mediastinal or hilar lymph nodes, noting limited sensitivity for the detection of hilar adenopathy on this noncontrast study.  Lungs/Pleura: No pneumothorax. No pleural effusion. Mild centrilobular emphysema. No acute consolidative airspace disease or lung masses. There are 2 scattered pulmonary nodules, the largest of which in the posterior left upper lobe measures 6.3 mm in volume derived mean diameter (series 9/image 116) and demonstrates aggressive features including slightly spiculated margins and tenting of the adjacent left major fissure.  Upper abdomen: No acute abnormality.  Musculoskeletal: No aggressive appearing focal osseous lesions. Minimal thoracic spondylosis.  IMPRESSION: Lung-RADS 4X, highly suspicious. 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 Additional imaging evaluation or consultation with Pulmonology or Thoracic Surgery recommended. Initial follow up low-dose chest CT without contrast in 3 months (please use the following order, "CT CHEST LCS NODULE FOLLOW-UP W/O CM") is recommended at a minimum. This nodule is below PET resolution.  Aortic Atherosclerosis  (ICD10-I70.0) and Emphysema (ICD10-J43.9).  These results will be called to the ordering clinician or representative by the Radiologist Assistant, and communication documented in the PACS or zVision Dashboard.   Electronically Signed   By: Ilona Sorrel M.D.   On: 05/07/2019 16:11 I personally reviewed the CT images and concur with the findings noted above.  I also reviewed the PET/CT images which showed no evidence of regional or distant metastatic disease.  The left upper lobe nodule was too small to characterize.  Pulmonary function testing FVC 2.52 (85%) FEV1 1.95 (85%) FEV1 1.80 (78%) postbronchodilator DLCO 17.61 (98%)  Impression: Patricia [name] is a 58 year old woman with a history of tobacco abuse, ethanol abuse, anxiety, depression, COPD, peripheral arterial embolization and a possible PFO (although not demonstrated on most recent echo in our system).  She recently saw Dr. Ardeth Perfect regarding a cough.  Given her smoking history a low-dose screening CT was done.  It demonstrated a 7 mm spiculated nodule in the left upper lobe adjacent to the major fissure with tenting of the fissure.  This was felt by radiology to be high risk lesion with a high likelihood of being a non-small cell carcinoma.  There was no hilar or mediastinal adenopathy.  On PET CT the nodule is too small to characterize but there was no evidence of regional or distant metastatic disease.  I reviewed the images with Mrs. Borak and her husband.  We discussed the differential diagnosis which includes non-small cell carcinoma, granulomatous disease, as well as some other infectious or inflammatory nodules.  We did discuss the radiographic features which make this concerning including spiculation and tenting of the major fissure.  We discussed potential options for diagnosis and treatment.  We discussed radiographic follow-up, bronchoscopic biopsy, CT-guided biopsy, or VATS for wedge resection for  definitive diagnosis and treatment with either segmentectomy or lobectomy at the same setting.  We discussed the relative advantages and disadvantages of each of those approaches.  The lesion is not amenable to CT-guided biopsy.  Bronchoscopic biopsy would be a very low yield and  relatively high risk procedure given the small size and proximity to the fissure.  That really leaves [place] with radiographic follow-up or proceeding for surgical resection.  She very strongly favors an aggressive approach.  Her mother died of lung cancer after initially having been found to have a small nodule.  She would like to have the nodule removed even if it turns out to be benign.  I discussed the proposed operation of left VATS for wedge resection followed by segmentectomy or possible lobectomy although I do not think that will be necessary.  We reviewed the general nature of the procedure including the need for general anesthesia, the incision to be used, the use of drains to postoperatively, the expected hospital stay, and the overall recovery.  We discussed the indications, risks, benefits, and alternatives.  She understands the risks include, but not limited to death, MI, DVT PE, bleeding, possible need for transfusion, infection, stroke, prolonged air leak, cardiac arrhythmias, as well as possibility of other unstable complications.  She understands accepts the risks and wishes to proceed with surgery  Plan: Left VATS for wedge resection and possible segmentectomy or lobectomy  [name], MD [facility] [phone]

## 2019-05-26 ENCOUNTER — Encounter: Payer: Self-pay | Admitting: *Deleted

## 2019-05-26 DIAGNOSIS — R911 Solitary pulmonary nodule: Secondary | ICD-10-CM

## 2019-05-26 NOTE — Progress Notes (Signed)
Patient saw Dr Roxan Hockey yesterday and has a plan for a VATS procedure on 06/07/19. When speaking to Dr Roxan Hockey he mentioned the possibility of also treating her pulmonary nodule via Radiation Oncology. Patient is asking [place] to place a referral with Rad Onc to get their opinion on treatment. She is also wanting to speak with Dr Maylon Peppers once more before making a final decision.  Recommended that she keep her surgical appointments until she makes her decision as she may lose her ability to have surgery completed this year. I was able to get patient a consult with Rad Onc next Tuesday. Dr Maylon Peppers doesn't have any available appointments, but can call her either Tuesday afternoon or Wednesday to discuss any final concerns so that patient can make her decision.  Called patient and made her aware of plan. We will follow up with her next week.

## 2019-05-31 ENCOUNTER — Encounter: Payer: Self-pay | Admitting: Radiation Oncology

## 2019-05-31 NOTE — Progress Notes (Signed)
Thoracic Location of Tumor / Histology: Left upper lobe lung nodule.   Patient presented to Dr. Ardeth Perfect for evaluation of a chronic cough. CT chest ordered revealing nodules. Referred to Dr. Maylon Peppers who ordered PET and referred to Dr. Roxan Hockey. Patient scheduled for VATS on 06/07/2019. Referred by Dr. Maylon Peppers to discuss radiotherapy options prior to surgery.   Biopsies of  (if applicable) revealed: n/a  Tobacco/Marijuana/Snuff/ETOH use: yes, 1 ppd x 41 years  Past/Anticipated interventions by cardiothoracic surgery, if any: schedule for VATS on 06/07/2019.  Past/Anticipated interventions by medical oncology, if any: consulted by Dr. Maylon Peppers, PET ordered, referral to Dr. Roxan Hockey to discuss surgical options, referral to Dr. Tammi Klippel to discuss radiotherapy options.  Signs/Symptoms  Weight changes, if any: no  Respiratory complaints, if any: Reports in the last two weeks her cough has resolved as she reduced her cigarette intake to 3-4 per day.   Hemoptysis, if any: denies   Pain issues, if any:  denies  Denies shortness of breath. Denies chest pain or pressure. Reports a good appetite. Reports dizziness and nervousness.  SAFETY ISSUES:  Prior radiation? denies  Pacemaker/ICD? denies   Possible current pregnancy?no  Is the patient on methotrexate? denies  Current Complaints / other details:  58 year old female. Married. Mother died of lung ca. Attempting to stop smoking.

## 2019-06-01 ENCOUNTER — Ambulatory Visit
Admission: RE | Admit: 2019-06-01 | Discharge: 2019-06-01 | Disposition: A | Discharge status: Home / Self Care | Payer: BC Managed Care – PPO | Source: Ambulatory Visit | Attending: Radiation Oncology | Admitting: Radiation Oncology

## 2019-06-01 ENCOUNTER — Encounter: Payer: Self-pay | Admitting: Radiation Oncology

## 2019-06-01 ENCOUNTER — Other Ambulatory Visit: Payer: Self-pay

## 2019-06-01 VITALS — Ht 60.0 in | Wt 143.0 lb

## 2019-06-01 DIAGNOSIS — C3412 Malignant neoplasm of upper lobe, left bronchus or lung: Secondary | ICD-10-CM | POA: Insufficient documentation

## 2019-06-01 DIAGNOSIS — R918 Other nonspecific abnormal finding of lung field: Secondary | ICD-10-CM | POA: Diagnosis not present

## 2019-06-01 DIAGNOSIS — R05 Cough: Secondary | ICD-10-CM | POA: Diagnosis not present

## 2019-06-01 DIAGNOSIS — F17210 Nicotine dependence, cigarettes, uncomplicated: Secondary | ICD-10-CM | POA: Diagnosis not present

## 2019-06-01 NOTE — Progress Notes (Signed)
See progress note under physician encounter. 

## 2019-06-01 NOTE — Progress Notes (Signed)
Radiation Oncology         [phone] ________________________________  Initial outpatient Consultation - Conducted via telephone due to current COVID-19 concerns for limiting patient exposure  Name: Patricia [name] MRN: [id number]  Date of Service: 06/01/2019 DOB: Aug 05, 1960  CC:[name], MD  [name], MD   REFERRING PHYSICIAN: [name], MD  DIAGNOSIS: Putative Stage I, NSCLC of the LUL lung    ICD-10-CM   1. 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 female seen at the request of Dr. Maylon Peppers. She is a current smoker with a 41 pack-year history. She recently presented to her PCP in Nov. 2020 with a chronic cough related to her smoking. She was referred for a low dose screening chest CT which was performed on 05/07/2019 and revealed a 6.3 mm lung-RADS 4X ledion in the  posterior left upper lobe with slightly spiculated margins and tenting of the adjacent left major fissure, concerning for a small lung adenocarcinoma.  She was referred for consult with Dr. Maylon Peppers on 05/11/2019, who recommended PET scan and thoracic surgery consultation. PET scan performed on 05/19/2019 showed no metabolic activity associated with the small left upper lobe pulmonary nodule although the size is below the limit for accurate PET characterization and more importantly, there was no evidence of lymphadenopathy or metastatic disease.  She met with Dr. Roxan Hockey in consult on 05/25/2019 who did not feel that the lesion was amenable to CT-guided biopsy given the small size and felt that bronchoscopy would be low yield (small size) with increased risks associated due to the proximity of the lesion to the adjacent fissure.  His recommendation was to proceed with surgical resection which would be both diagnostic and therapeutic/curative.  She is tentatively scheduled to undergo left VATS for wedge  resection on 06/07/2019 but requested a radiation consult to explore all options prior to making her final decision.  PREVIOUS RADIATION THERAPY: No  PAST MEDICAL HISTORY:  Past Medical History:  Diagnosis Date   Anemia    Aneurysm ([place])    pt states, "I was told I have an aneurysm in my hear 6, 7 years ago"   Anxiety    DVT (deep venous thrombosis) (HCC)    Hypertension    Lung cancer ([place])    Peripheral vascular disease ([place])       PAST SURGICAL HISTORY: Past Surgical History:  Procedure Laterality Date   BREAST SURGERY     CESAREAN SECTION     COLONOSCOPY  2006   Dr. Nyra Capes in [place]   COSMETIC SURGERY      FAMILY HISTORY:  Family History  Problem Relation Age of Onset   Heart disease Mother    Lung cancer Mother    Esophageal cancer Maternal Grandfather    Colon cancer Neg Hx    Rectal cancer Neg Hx    Stomach cancer Neg Hx     SOCIAL HISTORY:  Social History   Socioeconomic History   Marital status: Married    Spouse name: Not on file   Number of children: Not on file   Years of education: Not on file   Highest education level: Not on file  Occupational History   Not on file  Social Needs   Financial resource strain: Not on file   Food insecurity    Worry: Not on file    Inability: Not  on file   Transportation needs    Medical: Not on file    Non-medical: Not on file  Tobacco Use   Smoking status: Current Every Day Smoker    Packs/day: 0.50    Years: 30.00    Pack years: 15.00    Types: Cigarettes   Smokeless tobacco: Never Used  Substance and Sexual Activity   Alcohol use: Yes    Alcohol/week: 21.0 standard drinks    Types: 7 Glasses of wine, 14 Standard drinks or equivalent per week    Comment: Glass of wine nightly   Drug use: Yes    Types: Marijuana, Cocaine, Methamphetamines   Sexual activity: Yes  Lifestyle   Physical activity    Days per week: Not on file    Minutes per session: Not on file    Stress: Not on file  Relationships   Social connections    Talks on phone: Not on file    Gets together: Not on file    Attends religious service: Not on file    Active member of club or organization: Not on file    Attends meetings of clubs or organizations: Not on file    Relationship status: Not on file   Intimate partner violence    Fear of current or ex partner: Not on file    Emotionally abused: Not on file    Physically abused: Not on file    Forced sexual activity: Not on file  Other Topics Concern   Not on file  Social History Narrative   Not on file    ALLERGIES: Codeine and Latex  MEDICATIONS:  Current Outpatient Medications  Medication Sig Dispense Refill   aspirin EC 81 MG tablet Take 81 mg by mouth daily.     escitalopram (LEXAPRO) 20 MG tablet Take 20 mg by mouth daily.     famotidine (PEPCID) 20 MG tablet Take 20 mg by mouth at bedtime.     lisinopril-hydrochlorothiazide (ZESTORETIC) 10-12.5 MG per tablet Take one tablet (10-125 mg) daily for depression (Patient taking differently: Take 1 tablet by mouth daily. ) 30 tablet 11   Multiple Vitamin (MULTIVITAMIN WITH MINERALS) TABS tablet Take 1 tablet by mouth daily.     nicotine (NICODERM CQ - DOSED IN MG/24 HOURS) 21 mg/24hr patch Place 21 mg onto the skin daily.     rosuvastatin (CRESTOR) 5 MG tablet Take 5 mg by mouth daily.     influenza vac split quadrivalent PF (FLUARIX) 0.5 ML injection Fluzone Quad 2018-19(PF) 60 mcg(15 mcgx4)/0.5 mL intramuscular syringe     No current facility-administered medications for this encounter.     REVIEW OF SYSTEMS:  On review of systems, the patient reports that she is doing well overall. She denies any chest pain, shortness of breath, cough, hemoptysis, fevers, chills, night sweats, or unintended weight changes. She reports her cough has mostly resolved in the last two weeks as she reduced her cigarette intake to 3-4 cig./day. She denies any bowel or bladder  disturbances, and denies abdominal pain, nausea or vomiting. She denies any new musculoskeletal or joint aches or pains. She reports dizziness and nervousness. A complete review of systems is obtained and is otherwise negative.  PHYSICAL EXAM:  Wt Readings from Last 3 Encounters:  06/01/19 143 lb (64.9 kg)  05/25/19 143 lb (64.9 kg)  05/11/19 150 lb 6.4 oz (68.2 kg)   Temp Readings from Last 3 Encounters:  05/25/19 98.1 F (36.7 C)  05/11/19 98.6 F (37 C) (Temporal)  02/23/15 98.2 F (36.8 C)   BP Readings from Last 3 Encounters:  05/25/19 (!) 155/100  05/11/19 (!) 150/82  08/07/17 130/70   Pulse Readings from Last 3 Encounters:  05/25/19 95  05/11/19 77  08/07/17 76   Pain Assessment Pain Score: 0-No pain/10  Unable to assess due to telephone consult visit format.   KPS = 90  100 - Normal; no complaints; no evidence of disease. 90   - Able to carry on normal activity; minor signs or symptoms of disease. 80   - Normal activity with effort; some signs or symptoms of disease. 52   - Cares for self; unable to carry on normal activity or to do active work. 60   - Requires occasional assistance, but is able to care for most of his personal needs. 50   - Requires considerable assistance and frequent medical care. 40   - Disabled; requires special care and assistance. 54   - Severely disabled; hospital admission is indicated although death not imminent. 30   - Very sick; hospital admission necessary; active supportive treatment necessary. 10   - Moribund; fatal processes progressing rapidly. 0     - Dead  Karnofsky DA, Abelmann [place], Craver LS and Burchenal [facility] [phone]) The use of the nitrogen mustards in the palliative treatment of carcinoma: with particular reference to bronchogenic carcinoma Cancer 1 634-56  LABORATORY DATA:  Lab Results  Component Value Date   WBC 10.0 05/11/2019   HGB 13.9 05/11/2019   HCT 41.0 05/11/2019   MCV 94.5 05/11/2019   PLT 255 05/11/2019    Lab Results  Component Value Date   NA 140 05/11/2019   K 3.8 05/11/2019   CL 99 05/11/2019   CO2 29 05/11/2019   Lab Results  Component Value Date   ALT 67 (H) 05/11/2019   AST 39 05/11/2019   ALKPHOS 71 05/11/2019   BILITOT 0.4 05/11/2019     RADIOGRAPHY: Pet, Initial (skull Base To Thigh)  Result Date: 05/19/2019 CLINICAL DATA:  Initial treatment strategy for solitary pulmonary nodule. Strong smoking history EXAM: NUCLEAR MEDICINE PET SKULL BASE TO THIGH TECHNIQUE: 7.2 mCi F-18 FDG was injected intravenously. Full-ring PET imaging was performed from the skull base to thigh after the radiotracer. CT data was obtained and used for attenuation correction and anatomic localization. Fasting blood glucose: 120 mg/dl COMPARISON:  20 FINDINGS: Mediastinal blood pool activity: SUV max 2.1 Liver activity: SUV max NA NECK: No hypermetabolic lymph nodes in the neck. Incidental CT findings: none CHEST: Small nodule along the anterior margin of the LEFT horizontal fissure measures 4 mm (66/4 ) similar to 4 mm on comparison exam remeasured. Lesion does not have associated metabolic activity although the size is below the limit for accurate PET characterization. No hypermetabolic mediastinal lymph nodes. Incidental CT findings: none ABDOMEN/PELVIS: No abnormal hypermetabolic activity within the liver, pancreas, adrenal glands, or spleen. No hypermetabolic lymph nodes in the abdomen or pelvis. Incidental CT findings: Low-attenuation liver suggests hepatic steatosis SKELETON: No focal hypermetabolic activity to suggest skeletal metastasis. Incidental CT findings: none IMPRESSION: 1. No metabolic activity associated with small LEFT upper lobe pulmonary nodule. Consult recommendations on lung cancer screening from CT 05/07/2019 " Additional imaging evaluation or consultation with pulmonology or Thoracic Surgery recommended. Initial follow up low-dose chest CT without contrast in 3 months (please use the  following order, "CT CHEST LCS NODULE FOLLOW-UP W/O CM") is recommended at a minimum.  " 1. No evidence of metastatic disease. 2. Hepatic  steatosis Electronically Signed   By: Suzy Bouchard M.D.   On: 05/19/2019 11:55   Ct Chest Lung Ca Screen Low Dose W/o Cm  Result Date: 05/07/2019 CLINICAL DATA:  58 year old asymptomatic female current smoker with 41 pack-year smoking history. EXAM: CT CHEST WITHOUT CONTRAST LOW-DOSE FOR LUNG CANCER SCREENING TECHNIQUE: Multidetector CT imaging of the chest was performed following the standard protocol without IV contrast. COMPARISON:  None. FINDINGS: Cardiovascular: Normal heart size. No significant pericardial effusion/thickening. Mildly atherosclerotic nonaneurysmal thoracic aorta. Normal caliber pulmonary arteries. Mediastinum/Nodes: No discrete thyroid nodules. Unremarkable esophagus. No pathologically enlarged axillary, mediastinal or hilar lymph nodes, noting limited sensitivity for the detection of hilar adenopathy on this noncontrast study. Lungs/Pleura: No pneumothorax. No pleural effusion. Mild centrilobular emphysema. No acute consolidative airspace disease or lung masses. There are 2 scattered pulmonary nodules, the largest of which in the posterior left upper lobe measures 6.3 mm in volume derived mean diameter (series 9/image 116) and demonstrates aggressive features including slightly spiculated margins and tenting of the adjacent left major fissure. Upper abdomen: No acute abnormality. Musculoskeletal: No aggressive appearing focal osseous lesions. Minimal thoracic spondylosis. IMPRESSION: Lung-RADS 4X, highly suspicious. 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 Additional imaging evaluation or consultation with Pulmonology or Thoracic Surgery recommended. Initial follow up low-dose chest CT without contrast in 3 months  (please use the following order, "CT CHEST LCS NODULE FOLLOW-UP W/O CM") is recommended at a minimum. This nodule is below PET resolution. Aortic Atherosclerosis (ICD10-I70.0) and Emphysema (ICD10-J43.9). These results will be called to the ordering clinician or representative by the Radiologist Assistant, and communication documented in the PACS or zVision Dashboard. Electronically Signed   By: Ilona Sorrel M.D.   On: 05/07/2019 16:11      IMPRESSION/PLAN: This visit was conducted via telephone to spare the patient unnecessary potential exposure in the healthcare setting during the current COVID-19 pandemic. 1. 58 y.o. woman with putative Stage I, NSCLC, presenting with a solitary pulmonary nodule in the left upper lobe. Today, we talked to the patient and her husband about the findings and workup thus far. We discussed the natural history of NSCLC and general treatment, highlighting the role of radiotherapy in the management. We discussed the difference between stereotactic body radiation therapy (SBRT) and surgical resection in regards to outcome, side effects, and results. The patient is aware that surgical resection remains the gold standard of treatment of solitary, early stage lung cancers and would provide a pathologic confirmation whereas radiation therapy will not. We discussed the available radiation techniques, and focused on the details and logistics of delivery as it relates to SBRT. We reviewed the anticipated acute and late sequelae associated with radiation in this setting. Overall, the recommendation for this patient would be to proceed with surgical resection, as scheduled. She was encouraged to ask questions that were answered to her stated satisfaction.  At the end of our conversation, the patient appears to have a good understanding of her disease and our recommendations which are of curative intent.  She elects to proceed with surgical resection as scheduled with Dr. Roxan Hockey on  Monday, 06/07/2019.  We will share our discussion with Dr. Roxan Hockey so that they can move forward with treatment planning accordingly.  We enjoyed meeting her today and look forward to following her progress via correspondence.  Given current concerns for patient exposure during the COVID-19 pandemic, this encounter was  conducted via telephone. The patient was notified in advance and was offered a [place] meeting to allow for face to face communication but unfortunately reported that he did not have the appropriate resources/technology to support such a visit and instead preferred to proceed with telephone consult. The patient has given verbal consent for this type of encounter. The time spent during this encounter was 45 minutes. The attendants for this meeting include Tyler Pita MD, Ashlyn Bruning PA-C, Katie [place], patient MORGANN WOODBURN and her husband Coralyn Mark. During the encounter, Tyler Pita MD, Ashlyn Bruning PA-C, and scribe, Wilburn Mylar were located at [place].  Patient Joelee Snoke Kimzey-Pruitt and her husband were located at home.    Nicholos Johns, PA-C    Tyler Pita, MD  [facility] Direct Dial: [phone]   Fax: [phone] [email or web address]   Skype   [email or web address]   This document serves as a record of services personally performed by Tyler Pita, MD and Freeman Caldron, PA-C. It was created on their behalf by Wilburn Mylar, a trained medical scribe. The creation of this record is based on the scribe's personal observations and the provider's statements to them. This document has been checked and approved by the attending provider.

## 2019-06-02 NOTE — Progress Notes (Signed)
[id number] - [place], [place] - [address], [address] AT [facility] [address] [place], [address] [place] [address] Phone: [phone] Fax: [phone]      Your procedure is scheduled on Monday, December 7th, 2020.   Report to [facility] Main Entrance "A" at 5:30 A.M., and check in at the Admitting office.   Call this number if you have problems the morning of surgery:  [phone]  Call [phone] if you have any questions prior to your surgery date Monday-Friday 8am-4pm    Remember:  Do not eat or drink after midnight the night before your surgery  You may drink clear liquids until 4:30AM the morning of your surgery.    Clear liquids allowed are: Water, Non-Citrus Juices (without pulp), Carbonated Beverages, Clear Tea, Black Coffee Only, and Gatorade    Take these medicines the morning of surgery with A SIP OF WATER :  Escitalopram (Lexapro) Famotidine (Pepcid) Rosuvastatin (Crestor)  7 days prior to surgery STOP taking any Aspirin (unless otherwise instructed by your surgeon), Aleve, Naproxen, Ibuprofen, Motrin, Advil, Goody's, BC's, all herbal medications, fish oil, and all vitamins.    The Morning of Surgery  Do not wear jewelry, make-up or nail polish.  Do not wear lotions, powders, or perfumes/colognes, or deodorant  Do not shave 48 hours prior to surgery.  Men may shave face and neck.  Do not bring valuables to the hospital.  [facility] is not responsible for any belongings or valuables.  If you are a smoker, DO NOT Smoke 24 hours prior to surgery  If you wear a CPAP at night please bring your mask, tubing, and machine the morning of surgery   Remember that you must have someone to transport you home after your surgery, and remain with you for 24 hours if you are discharged the same day.   Please bring cases for contacts, glasses, hearing aids, dentures or bridgework because it cannot be worn into surgery.     Leave your suitcase in the car.  After surgery it may be brought to your room.  For patients admitted to the hospital, discharge time will be determined by your treatment team.  Patients discharged the day of surgery will not be allowed to drive home.    Special instructions:   [place]- Preparing For Surgery  Before surgery, you can play an important role. Because skin is not sterile, your skin needs to be as free of germs as possible. You can reduce the number of germs on your skin by washing with CHG (chlorahexidine gluconate) Soap before surgery.  CHG is an antiseptic cleaner which kills germs and bonds with the skin to continue killing germs even after washing.    Oral Hygiene is also important to reduce your risk of infection.  Remember - BRUSH YOUR TEETH THE MORNING OF SURGERY WITH YOUR REGULAR TOOTHPASTE  Please do not use if you have an allergy to CHG or antibacterial soaps. If your skin becomes reddened/irritated stop using the CHG.  Do not shave (including legs and underarms) for at least 48 hours prior to first CHG shower. It is OK to shave your face.  Please follow these instructions carefully.   1. Shower the NIGHT BEFORE SURGERY and the MORNING OF SURGERY with CHG Soap.   2. If you chose to wash your hair, wash your hair first as usual with your normal shampoo.  3. After you shampoo, rinse your hair and body thoroughly to  remove the shampoo.  4. Use CHG as you would any other liquid soap. You can apply CHG directly to the skin and wash gently with a scrungie or a clean washcloth.   5. Apply the CHG Soap to your body ONLY FROM THE NECK DOWN.  Do not use on open wounds or open sores. Avoid contact with your eyes, ears, mouth and genitals (private parts). Wash Face and genitals (private parts)  with your normal soap.   6. Wash thoroughly, paying special attention to the area where your surgery will be performed.  7. Thoroughly rinse your body with warm water from  the neck down.  8. DO NOT shower/wash with your normal soap after using and rinsing off the CHG Soap.  9. Pat yourself dry with a CLEAN TOWEL.  10. Wear CLEAN PAJAMAS to bed the night before surgery, wear comfortable clothes the morning of surgery  11. Place CLEAN SHEETS on your bed the night of your first shower and DO NOT SLEEP WITH PETS.    Day of Surgery:  Please shower the morning of surgery with the CHG soap Do not apply any deodorants/lotions. Please wear clean clothes to the hospital/surgery center.   Remember to brush your teeth WITH YOUR REGULAR TOOTHPASTE.   Please read over the following fact sheets that you were given.

## 2019-06-03 ENCOUNTER — Other Ambulatory Visit: Payer: Self-pay

## 2019-06-03 ENCOUNTER — Encounter (HOSPITAL_COMMUNITY): Payer: Self-pay

## 2019-06-03 ENCOUNTER — Encounter (HOSPITAL_COMMUNITY)
Admission: RE | Admit: 2019-06-03 | Discharge: 2019-06-03 | Disposition: A | Discharge status: Home / Self Care | Payer: BC Managed Care – PPO | Source: Ambulatory Visit | Attending: Thoracic Surgery (Cardiothoracic Vascular Surgery) | Admitting: Thoracic Surgery (Cardiothoracic Vascular Surgery)

## 2019-06-03 ENCOUNTER — Other Ambulatory Visit (HOSPITAL_COMMUNITY)
Admission: RE | Admit: 2019-06-03 | Discharge: 2019-06-03 | Disposition: A | Discharge status: Home / Self Care | Payer: BC Managed Care – PPO | Source: Ambulatory Visit

## 2019-06-03 DIAGNOSIS — Z01812 Encounter for preprocedural laboratory examination: Secondary | ICD-10-CM | POA: Insufficient documentation

## 2019-06-03 DIAGNOSIS — R05 Cough: Secondary | ICD-10-CM | POA: Insufficient documentation

## 2019-06-03 DIAGNOSIS — I10 Essential (primary) hypertension: Secondary | ICD-10-CM | POA: Diagnosis not present

## 2019-06-03 DIAGNOSIS — Z7982 Long term (current) use of aspirin: Secondary | ICD-10-CM | POA: Diagnosis not present

## 2019-06-03 DIAGNOSIS — D649 Anemia, unspecified: Secondary | ICD-10-CM | POA: Diagnosis not present

## 2019-06-03 DIAGNOSIS — Z79899 Other long term (current) drug therapy: Secondary | ICD-10-CM | POA: Diagnosis not present

## 2019-06-03 DIAGNOSIS — R911 Solitary pulmonary nodule: Secondary | ICD-10-CM | POA: Diagnosis not present

## 2019-06-03 DIAGNOSIS — Z20828 Contact with and (suspected) exposure to other viral communicable diseases: Secondary | ICD-10-CM | POA: Diagnosis not present

## 2019-06-03 DIAGNOSIS — F17210 Nicotine dependence, cigarettes, uncomplicated: Secondary | ICD-10-CM | POA: Diagnosis not present

## 2019-06-03 DIAGNOSIS — Z7901 Long term (current) use of anticoagulants: Secondary | ICD-10-CM | POA: Insufficient documentation

## 2019-06-03 DIAGNOSIS — F419 Anxiety disorder, unspecified: Secondary | ICD-10-CM | POA: Diagnosis not present

## 2019-06-03 HISTORY — DX: Aneurysm of heart: I25.3

## 2019-06-03 HISTORY — DX: Cough, unspecified: R05.9

## 2019-06-03 HISTORY — DX: Other complications of anesthesia, initial encounter: T88.59XA

## 2019-06-03 HISTORY — DX: Solitary pulmonary nodule: R91.1

## 2019-06-03 HISTORY — DX: Patent foramen ovale: Q21.12

## 2019-06-03 HISTORY — DX: Atrial septal defect: Q21.1

## 2019-06-03 LAB — COMPREHENSIVE METABOLIC PANEL
ALT: 58 U/L — ABNORMAL HIGH (ref 0–44)
AST: 32 U/L (ref 15–41)
Albumin: 3.8 g/dL (ref 3.5–5.0)
Alkaline Phosphatase: 75 U/L (ref 38–126)
Anion gap: 9 (ref 5–15)
BUN: 15 mg/dL (ref 6–20)
CO2: 21 mmol/L — ABNORMAL LOW (ref 22–32)
Calcium: 8.9 mg/dL (ref 8.9–10.3)
Chloride: 103 mmol/L (ref 98–111)
Creatinine, Ser: 0.6 mg/dL (ref 0.44–1.00)
GFR calc Af Amer: >60 mL/min (ref >60)
GFR calc non Af Amer: >60 mL/min (ref >60)
Glucose, Bld: 99 mg/dL (ref 70–99)
Potassium: 3.9 mmol/L (ref 3.5–5.1)
Sodium: 133 mmol/L — ABNORMAL LOW (ref 135–145)
Total Bilirubin: 0.3 mg/dL (ref 0.3–1.2)
Total Protein: 6.6 g/dL (ref 6.5–8.1)

## 2019-06-03 LAB — URINALYSIS, ROUTINE W REFLEX MICROSCOPIC
Bilirubin Urine: NEGATIVE
Glucose, UA: NEGATIVE mg/dL
Hgb urine dipstick: NEGATIVE
Ketones, ur: 5 mg/dL — AB
Leukocytes,Ua: NEGATIVE
Nitrite: NEGATIVE
Protein, ur: NEGATIVE mg/dL
Specific Gravity, Urine: 1.023 (ref 1.005–1.030)
pH: 6 (ref 5.0–8.0)

## 2019-06-03 LAB — SURGICAL PCR SCREEN
MRSA, PCR: NEGATIVE
Staphylococcus aureus: NEGATIVE

## 2019-06-03 LAB — PROTIME-INR
INR: 1 (ref 0.8–1.2)
Prothrombin Time: 12.7 seconds (ref 11.4–15.2)

## 2019-06-03 LAB — CBC
HCT: 38.2 % (ref 36.0–46.0)
Hemoglobin: 13.4 g/dL (ref 12.0–15.0)
MCH: 32.5 pg (ref 26.0–34.0)
MCHC: 35.1 g/dL (ref 30.0–36.0)
MCV: 92.7 fL (ref 80.0–100.0)
Platelets: 245 10*3/uL (ref 150–400)
RBC: 4.12 MIL/uL (ref 3.87–5.11)
RDW: 12.2 % (ref 11.5–15.5)
WBC: 8.7 10*3/uL (ref 4.0–10.5)
nRBC: 0 % (ref 0.0–0.2)

## 2019-06-03 LAB — BLOOD GAS, ARTERIAL
Acid-Base Excess: 0.5 mmol/L (ref 0.0–2.0)
Bicarbonate: 24.4 mmol/L (ref 20.0–28.0)
Drawn by: 421801
FIO2: 21
O2 Saturation: 97.5 %
Patient temperature: 37
pCO2 arterial: 37.5 mmHg (ref 32.0–48.0)
pH, Arterial: 7.429 (ref 7.350–7.450)
pO2, Arterial: 97.3 mmHg (ref 83.0–108.0)

## 2019-06-03 LAB — APTT: aPTT: 26 seconds (ref 24–36)

## 2019-06-03 LAB — TYPE AND SCREEN
ABO/RH(D): B POS
Antibody Screen: NEGATIVE

## 2019-06-03 LAB — ABO/RH: ABO/RH(D): B POS

## 2019-06-03 NOTE — Progress Notes (Signed)
.  PCP - DR NIDPOEUM Cardiologist - DR KRASOWSKI  ----HE IS AWARE OF SURGERY   -    Chest x-ray - DOS EKG - TODAY Stress Test -15 YRS AGO  ECHO - 2/19 Cardiac Cath - NA         s: Aspirin Instructions:STOP l -  COVID TEST- YODAY   Anesthesia review: REVIEW  Patient denies shortness of breath, fever, cough and chest pain at PAT appointment   All instructions explained to the patient, with a verbal understanding of the material. Patient agrees to go over the instructions while at home for a better understanding. Patient also instructed to self quarantine after being tested for COVID-19. The opportunity to ask questions was provided.

## 2019-06-04 ENCOUNTER — Encounter (HOSPITAL_COMMUNITY): Payer: Self-pay

## 2019-06-04 NOTE — Progress Notes (Signed)
Anesthesia Chart Review:  Case: [id number] Date/Time: 06/07/19 0715   Procedures:      VIDEO ASSISTED THORACOSCOPY (VATS)/WEDGE RESECTION (Left Chest)     possible SEGMENTECTOMY (Left )     LOBECTOMY (Left )   Anesthesia type: General   Pre-op diagnosis: LUL LUNG NODULE   Location: [facility] OR ROOM 10 / [facility]   Surgeon: [name], MD      DISCUSSION: Patient is a 58 year old female scheduled for the above procedure. LUL lung nodule on imaging seen on evaluation for chronic cough. Appearance of nodule on CT was very suspicious for lung cancer although not hypermetabolic on PET scan.  Her mother died  from lung cancer.  Surgical resection advised for definitive diagnosis.  History includes smoking, HTN, PVD (according to notes scanned under the Media tab: acute right popliteal artery thrombosis, s/p mechanical thrombectomy and thrombolysis 06/16/07, difficulty maintaining therapeutic anticoagulation level, developed recurrent RLE claudication and scheduled for right popliteal artery thrombectomy 07/22/07; work-up included TEE which "did not show evidence of thrombosis; however, there is evidence of a small patent foramen ovale with associated small aneurysmal finding"; long-term anticoagulation recommended by cardiologist Laddie Aquas, MD, but after 6-12 months, patient opted for ASA alone; no obvious indication to resume anticoagulation without recurrent event as of 08/2017 per cardiologist Dr. 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 21 standard drinks of alcohol weekly. Prior use of cocaine, marijuana, methamphetamines, but denied current use per documentation. Reports she woke up during a procedure while under anesthesia in the past, otherwise no specific details known.   Dr. Roxan Hockey classified her Zubrod Score of 0 (normal activity, no  restrictions).  06/03/19 COVID-19 test in process.  If negative and otherwise no acute changes and I would anticipate that she could proceed as planned.   VS: BP 132/76   Pulse 85   Temp 37 C (Oral)   Resp 18   Ht 5' (1.524 m)   Wt 68.5 kg   SpO2 100%   BMI 29.50 kg/m     PROVIDERS: [name], MD his PCP - Jenne Campus, MD is cardiologist. Seen for PAD follow-up 08/07/17. He reviewed 2008-2009 records he had received from [place] regarding right popliteal artery occlusion and finding of small PFO (these are scanned under the Media tab). He wrote, "She was told that she will need to have PFO fixed she was also told that she may require anticoagulant until the end of her life.  She was on Coumadin for 6 months and then she discontinued that medication seems to be doing well from that point of view now.  No evidence of any emboli or no evidence of CVA.  She complained of having some pain in both legs actually right appears to be worse than left I did arterial duplex of lower extremity which showed completely occluded to the left ATA.  Interestingly she does not have much symptoms from that side.  We talked at length about what to do with the situation I think the key now will be to modify her risk factors for peripheral vascular disease.  We also talked at length about potentially putting her anticoagulation however indications are not that obvious now.  Still I suspect that what she had was unprovoked event she vaguely remember having some injury to her right lower extremities that eventually she she ended having blood clot.  I did some hypercoagulopathy  workup which all came negative."  - Tish Men, MD is HEM-ONC   LABS: Labs reviewed: Acceptable for surgery. (all labs ordered are listed, but only abnormal results are displayed)  Labs Reviewed  COMPREHENSIVE METABOLIC PANEL - Abnormal; Notable for the following components:      Result Value   Sodium 133 (*)    CO2 21 (*)    ALT 58  (*)    All other components within normal limits  URINALYSIS, ROUTINE W REFLEX MICROSCOPIC - Abnormal; Notable for the following components:   Ketones, ur 5 (*)    All other components within normal limits  SURGICAL PCR SCREEN  APTT  BLOOD GAS, ARTERIAL  CBC  PROTIME-INR  TYPE AND SCREEN  ABO/RH    Pulmonary function testing 05/21/19: FVC 2.52 (85%) FEV1 1.95 (85%) FEV1 1.80 (78%) postbronchodilator DLCO 17.61 (98%)   IMAGES: PET Scan 05/19/19: IMPRESSION: 1. No metabolic activity associated with small LEFT upper lobe pulmonary nodule. Consult recommendations on lung cancer screening from CT 05/07/2019 " Additional imaging evaluation or consultation with pulmonology or Thoracic Surgery recommended. Initial follow up low-dose chest CT without contrast in 3 months (please use the following order, "CT CHEST LCS NODULE FOLLOW-UP W/O CM") is recommended at a minimum.  " 1. No evidence of metastatic disease. 2. Hepatic steatosis  CT Chest lung cancer screen low dose 05/07/19: IMPRESSION: - Lung-RADS 4X, highly suspicious. 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 Additional imaging evaluation or consultation with Pulmonology or Thoracic Surgery recommended. Initial follow up low-dose chest CT without contrast in 3 months (please use the following order, "CT CHEST LCS NODULE FOLLOW-UP W/O CM") is recommended at a minimum. This nodule is below PET resolution. - Aortic Atherosclerosis (ICD10-I70.0) and Emphysema (ICD10-J43.9).   EKG: 06/03/19: NSR   CV: Echo 08/06/17: Study Conclusions - Left ventricle: The cavity size was normal. Systolic function was   normal. The estimated ejection fraction was in the range of 55%   to 60%. Wall motion was normal; there were no regional wall   motion abnormalities. Left ventricular diastolic function    parameters were normal. - Aortic valve: Valve area (VTI): 1.83 cm^2. Valve area (Vmax):   2.02 cm^2. Valve area (Vmean): 1.95 cm^2. Impressions: - Normal LVEF.   Mild TR  Stress Echo 09/29/07 ([facility], scanned under Media tab) 1.  Normal global left ventricular systolic function with an EF of 65 to 70%. 2.  Post-stress there is normal increase in left ventricular systolic function without evidence of new focal wall motion abnormalities. 3.  A bubble study was performed with agitated saline to rule out inter-atrial septal defect, patent foramen ovale. 1 of 3 bubble studies is mildly positive.  - 07/17/07 note by vascular surgeon Sandrea Matte, MD indicates that previous TEE done ~ 06/2007 at time of acute right popliteal artery occlusion revealed no evidence of thrombus; " however, there is evidence of a small patent foramen ovale with associated small aneurysmal finding.  Per recommendation from Dr. Laddie Aquas, patient would require long-term anticoagulation therapy." Additional notes also indicate that she had some difficulty maintaining a therapeutic anticoagulation level, and she opted to remain on ASA alone after 6-12 months of anticoagulation.     Past Medical History:  Diagnosis Date  . Anemia   . Anxiety   . Complication of anesthesia    woken up before over  .  Cough   . Hypertension   . Nodule of lower lobe of left lung   . Peripheral vascular disease ([place])    acute right popliteal artery thrombosis, s/p mechanical thrombectomy and thrombolysis 06/16/07 in [place]; scheduled for surgical right popliteal artery thrombectomy 07/22/07  . PFO with atrial septal aneurysm    07/17/07 vascular surgery notes by Dr. Sandrea Matte, TEE done as part of acute right popliteal artery occlusion work-up showed no evidence of thrombus; howevever "there is evidence of a small patent foramen ovale with associated small aneuysmal finding"    Past Surgical History:   Procedure Laterality Date  . BREAST SURGERY     reduction  . CESAREAN SECTION    . COLONOSCOPY  2006   Dr. Nyra Capes in [place]  . COSMETIC SURGERY    . tummy tuck      MEDICATIONS: . diphenhydrAMINE (BENADRYL) 25 MG tablet  . Melatonin 5 MG TABS  . aspirin EC 81 MG tablet  . escitalopram (LEXAPRO) 20 MG tablet  . famotidine (PEPCID) 20 MG tablet  . influenza vac split quadrivalent PF (FLUARIX) 0.5 ML injection  . lisinopril-hydrochlorothiazide (ZESTORETIC) 10-12.5 MG per tablet  . Multiple Vitamin (MULTIVITAMIN WITH MINERALS) TABS tablet  . nicotine (NICODERM CQ - DOSED IN MG/24 HOURS) 21 mg/24hr patch  . rosuvastatin (CRESTOR) 5 MG tablet   No current facility-administered medications for this encounter.     Myra Gianotti, PA-C Surgical Short Stay/Anesthesiology [facility] Phone [phone] [facility] Phone [phone] 06/04/2019 12:15 PM

## 2019-06-04 NOTE — Anesthesia Preprocedure Evaluation (Addendum)
Anesthesia Evaluation  Patient identified by MRN, date of birth, ID band Patient awake    Reviewed: Allergy & Precautions, NPO status , Patient's Chart, lab work & pertinent test results  History of Anesthesia Complications Negative for: history of anesthetic complications  Airway Mallampati: II  TM Distance: >3 FB Neck ROM: Full    Dental  (+) Dental Advisory Given, Teeth Intact   Pulmonary Current Smoker and Patient abstained from smoking.,   LUL nodule    Pulmonary exam normal        Cardiovascular hypertension, Pt. on medications + Peripheral Vascular Disease  Normal cardiovascular exam   PFO  '19 TTE - Normal EF, mild TR    Neuro/Psych PSYCHIATRIC DISORDERS Anxiety Depression  Hx suicide attempt(s), benzodiazepine OD negative neurological ROS     GI/Hepatic negative GI ROS, (+)     substance abuse (no recent cocaine or meth)  alcohol use, cocaine use, marijuana use and methamphetamine use,   Endo/Other   Obesity   Renal/GU negative Renal ROS     Musculoskeletal negative musculoskeletal ROS (+)   Abdominal   Peds  Hematology negative hematology ROS (+)   Anesthesia Other Findings Covid negative 12/3   Reproductive/Obstetrics                           Anesthesia Physical Anesthesia Plan  ASA: III  Anesthesia Plan: General   Post-op Pain Management:    Induction: Intravenous  PONV Risk Score and Plan: 4 or greater and Treatment may vary due to age or medical condition, Ondansetron, Dexamethasone and Midazolam  Airway Management Planned: Double Lumen EBT  Additional Equipment: Arterial line and CVP  Intra-op Plan:   Post-operative Plan: Extubation in OR  Informed Consent: I have reviewed the patients History and Physical, chart, labs and discussed the procedure including the risks, benefits and alternatives for the proposed anesthesia with the patient or  authorized representative who has indicated his/her understanding and acceptance.     Dental advisory given  Plan Discussed with: CRNA and Anesthesiologist  Anesthesia Plan Comments:       Anesthesia Quick Evaluation

## 2019-06-06 LAB — NOVEL CORONAVIRUS, NAA (HOSP ORDER, SEND-OUT TO REF LAB; TAT 18-24 HRS): SARS-CoV-2, NAA: NOT DETECTED

## 2019-06-07 ENCOUNTER — Other Ambulatory Visit: Payer: Self-pay

## 2019-06-07 ENCOUNTER — Ambulatory Visit: Payer: BLUE CROSS/BLUE SHIELD | Admitting: Gastroenterology

## 2019-06-07 ENCOUNTER — Encounter (HOSPITAL_COMMUNITY)
Admission: RE | Disposition: A | Discharge status: Home / Self Care | Payer: Self-pay | Source: Home / Self Care | Attending: Thoracic Surgery (Cardiothoracic Vascular Surgery)

## 2019-06-07 ENCOUNTER — Inpatient Hospital Stay (HOSPITAL_COMMUNITY)
Admission: RE | Admit: 2019-06-07 | Discharge: 2019-06-11 | DRG: 164 | Disposition: A | Discharge status: Home / Self Care | Payer: BC Managed Care – PPO | Attending: Thoracic Surgery (Cardiothoracic Vascular Surgery) | Admitting: Thoracic Surgery (Cardiothoracic Vascular Surgery)

## 2019-06-07 ENCOUNTER — Inpatient Hospital Stay (HOSPITAL_COMMUNITY): Payer: BC Managed Care – PPO

## 2019-06-07 ENCOUNTER — Inpatient Hospital Stay (HOSPITAL_COMMUNITY): Payer: BC Managed Care – PPO | Admitting: Anesthesiology

## 2019-06-07 ENCOUNTER — Encounter (HOSPITAL_COMMUNITY): Payer: Self-pay | Admitting: *Deleted

## 2019-06-07 ENCOUNTER — Inpatient Hospital Stay (HOSPITAL_COMMUNITY): Payer: BC Managed Care – PPO | Admitting: Vascular Surgery

## 2019-06-07 DIAGNOSIS — Z20828 Contact with and (suspected) exposure to other viral communicable diseases: Secondary | ICD-10-CM | POA: Diagnosis present

## 2019-06-07 DIAGNOSIS — I739 Peripheral vascular disease, unspecified: Secondary | ICD-10-CM | POA: Diagnosis not present

## 2019-06-07 DIAGNOSIS — J449 Chronic obstructive pulmonary disease, unspecified: Secondary | ICD-10-CM | POA: Diagnosis present

## 2019-06-07 DIAGNOSIS — E669 Obesity, unspecified: Secondary | ICD-10-CM | POA: Diagnosis present

## 2019-06-07 DIAGNOSIS — Z801 Family history of malignant neoplasm of trachea, bronchus and lung: Secondary | ICD-10-CM | POA: Diagnosis not present

## 2019-06-07 DIAGNOSIS — R911 Solitary pulmonary nodule: Secondary | ICD-10-CM | POA: Diagnosis not present

## 2019-06-07 DIAGNOSIS — F17210 Nicotine dependence, cigarettes, uncomplicated: Secondary | ICD-10-CM | POA: Diagnosis not present

## 2019-06-07 DIAGNOSIS — D62 Acute posthemorrhagic anemia: Secondary | ICD-10-CM | POA: Diagnosis not present

## 2019-06-07 DIAGNOSIS — J90 Pleural effusion, not elsewhere classified: Secondary | ICD-10-CM | POA: Diagnosis not present

## 2019-06-07 DIAGNOSIS — J984 Other disorders of lung: Secondary | ICD-10-CM | POA: Diagnosis not present

## 2019-06-07 DIAGNOSIS — F10239 Alcohol dependence with withdrawal, unspecified: Secondary | ICD-10-CM | POA: Diagnosis present

## 2019-06-07 DIAGNOSIS — F329 Major depressive disorder, single episode, unspecified: Secondary | ICD-10-CM | POA: Diagnosis present

## 2019-06-07 DIAGNOSIS — I10 Essential (primary) hypertension: Secondary | ICD-10-CM | POA: Diagnosis present

## 2019-06-07 DIAGNOSIS — Z86718 Personal history of other venous thrombosis and embolism: Secondary | ICD-10-CM

## 2019-06-07 DIAGNOSIS — R918 Other nonspecific abnormal finding of lung field: Secondary | ICD-10-CM | POA: Diagnosis not present

## 2019-06-07 DIAGNOSIS — R0602 Shortness of breath: Secondary | ICD-10-CM

## 2019-06-07 DIAGNOSIS — F419 Anxiety disorder, unspecified: Secondary | ICD-10-CM | POA: Diagnosis present

## 2019-06-07 DIAGNOSIS — J9811 Atelectasis: Secondary | ICD-10-CM | POA: Diagnosis not present

## 2019-06-07 DIAGNOSIS — Z9889 Other specified postprocedural states: Secondary | ICD-10-CM

## 2019-06-07 DIAGNOSIS — Z6830 Body mass index (BMI) 30.0-30.9, adult: Secondary | ICD-10-CM

## 2019-06-07 DIAGNOSIS — Z09 Encounter for follow-up examination after completed treatment for conditions other than malignant neoplasm: Secondary | ICD-10-CM

## 2019-06-07 DIAGNOSIS — Z01818 Encounter for other preprocedural examination: Secondary | ICD-10-CM | POA: Diagnosis not present

## 2019-06-07 DIAGNOSIS — E785 Hyperlipidemia, unspecified: Secondary | ICD-10-CM | POA: Diagnosis not present

## 2019-06-07 DIAGNOSIS — Z4682 Encounter for fitting and adjustment of non-vascular catheter: Secondary | ICD-10-CM

## 2019-06-07 HISTORY — PX: VIDEO ASSISTED THORACOSCOPY (VATS)/WEDGE RESECTION: SHX6174

## 2019-06-07 LAB — GLUCOSE, CAPILLARY: Glucose-Capillary: 145 mg/dL — ABNORMAL HIGH (ref 70–99)

## 2019-06-07 SURGERY — VIDEO ASSISTED THORACOSCOPY (VATS)/WEDGE RESECTION
Anesthesia: General | Site: Chest | Laterality: Left

## 2019-06-07 MED ORDER — PROMETHAZINE HCL 25 MG/ML IJ SOLN: 6.2500 mg | INTRAMUSCULAR | Status: DC | PRN

## 2019-06-07 MED ORDER — ASPIRIN EC 81 MG PO TBEC
81.0000 mg | DELAYED_RELEASE_TABLET | Freq: Every day | ORAL | Status: DC
  Administered 2019-06-08 – 2019-06-11 (×4): 81 mg via ORAL
  Filled 2019-06-07 (×4): qty 1

## 2019-06-07 MED ORDER — TRAMADOL HCL 50 MG PO TABS
50.0000 mg | ORAL_TABLET | Freq: Four times a day (QID) | ORAL | Status: DC | PRN
  Administered 2019-06-08: 100 mg via ORAL
  Filled 2019-06-07: qty 2

## 2019-06-07 MED ORDER — SODIUM CHLORIDE (PF) 0.9 % IJ SOLN
INTRAMUSCULAR | Status: DC | PRN
  Administered 2019-06-07: 50 mL via INTRAVENOUS

## 2019-06-07 MED ORDER — ACETAMINOPHEN 160 MG/5ML PO SOLN: 1000.0000 mg | Freq: Four times a day (QID) | ORAL | Status: DC

## 2019-06-07 MED ORDER — DIPHENHYDRAMINE HCL 50 MG/ML IJ SOLN: 12.5000 mg | Freq: Four times a day (QID) | INTRAMUSCULAR | Status: DC | PRN

## 2019-06-07 MED ORDER — DEXAMETHASONE SODIUM PHOSPHATE 10 MG/ML IJ SOLN
INTRAMUSCULAR | Status: DC | PRN
  Administered 2019-06-07: 10 mg via INTRAVENOUS

## 2019-06-07 MED ORDER — BISACODYL 5 MG PO TBEC
10.0000 mg | DELAYED_RELEASE_TABLET | Freq: Every day | ORAL | Status: DC
  Administered 2019-06-08 – 2019-06-09 (×2): 10 mg via ORAL
  Filled 2019-06-07 (×2): qty 2

## 2019-06-07 MED ORDER — BUPIVACAINE LIPOSOME 1.3 % IJ SUSP
20.0000 mL | Freq: Once | INTRAMUSCULAR | Status: DC
  Filled 2019-06-07: qty 20

## 2019-06-07 MED ORDER — SUFENTANIL CITRATE 50 MCG/ML IV SOLN
INTRAVENOUS | Status: AC
  Filled 2019-06-07: qty 1

## 2019-06-07 MED ORDER — PROPOFOL 10 MG/ML IV BOLUS
INTRAVENOUS | Status: AC
  Filled 2019-06-07: qty 20

## 2019-06-07 MED ORDER — ONDANSETRON HCL 4 MG/2ML IJ SOLN
INTRAMUSCULAR | Status: DC | PRN
  Administered 2019-06-07: 4 mg via INTRAVENOUS

## 2019-06-07 MED ORDER — NICOTINE 21 MG/24HR TD PT24
21.0000 mg | MEDICATED_PATCH | Freq: Every day | TRANSDERMAL | Status: DC
  Administered 2019-06-07 – 2019-06-11 (×5): 21 mg via TRANSDERMAL
  Filled 2019-06-07 (×5): qty 1

## 2019-06-07 MED ORDER — ENOXAPARIN SODIUM 40 MG/0.4ML ~~LOC~~ SOLN
40.0000 mg | Freq: Every day | SUBCUTANEOUS | Status: DC
  Administered 2019-06-07 – 2019-06-11 (×5): 40 mg via SUBCUTANEOUS
  Filled 2019-06-07 (×5): qty 0.4

## 2019-06-07 MED ORDER — FENTANYL CITRATE (PF) 100 MCG/2ML IJ SOLN
25.0000 ug | INTRAMUSCULAR | Status: DC | PRN
  Administered 2019-06-07 (×2): 50 ug via INTRAVENOUS

## 2019-06-07 MED ORDER — LIDOCAINE 2% (20 MG/ML) 5 ML SYRINGE
INTRAMUSCULAR | Status: AC
  Filled 2019-06-07: qty 5

## 2019-06-07 MED ORDER — PHENYLEPHRINE 40 MCG/ML (10ML) SYRINGE FOR IV PUSH (FOR BLOOD PRESSURE SUPPORT)
PREFILLED_SYRINGE | INTRAVENOUS | Status: DC | PRN
  Administered 2019-06-07: 80 ug via INTRAVENOUS
  Administered 2019-06-07: 40 ug via INTRAVENOUS
  Administered 2019-06-07: 80 ug via INTRAVENOUS
  Administered 2019-06-07: 40 ug via INTRAVENOUS
  Administered 2019-06-07 (×2): 80 ug via INTRAVENOUS
  Administered 2019-06-07: 40 ug via INTRAVENOUS
  Administered 2019-06-07: 80 ug via INTRAVENOUS
  Administered 2019-06-07: 40 ug via INTRAVENOUS

## 2019-06-07 MED ORDER — DIPHENHYDRAMINE HCL 25 MG PO TABS
25.0000 mg | ORAL_TABLET | Freq: Every evening | ORAL | Status: DC | PRN
  Filled 2019-06-07: qty 1

## 2019-06-07 MED ORDER — BUPIVACAINE LIPOSOME 1.3 % IJ SUSP
INTRAMUSCULAR | Status: DC | PRN
  Administered 2019-06-07: 20 mL

## 2019-06-07 MED ORDER — NALOXONE HCL 0.4 MG/ML IJ SOLN: 0.4000 mg | INTRAMUSCULAR | Status: DC | PRN

## 2019-06-07 MED ORDER — SODIUM CHLORIDE 0.9% FLUSH: 9.0000 mL | INTRAVENOUS | Status: DC | PRN

## 2019-06-07 MED ORDER — DIPHENHYDRAMINE HCL 12.5 MG/5ML PO ELIX
12.5000 mg | ORAL_SOLUTION | Freq: Four times a day (QID) | ORAL | Status: DC | PRN
  Administered 2019-06-07 – 2019-06-08 (×2): 12.5 mg via ORAL
  Filled 2019-06-07 (×2): qty 5

## 2019-06-07 MED ORDER — DEXAMETHASONE SODIUM PHOSPHATE 10 MG/ML IJ SOLN
INTRAMUSCULAR | Status: AC
  Filled 2019-06-07: qty 1

## 2019-06-07 MED ORDER — MIDAZOLAM HCL 2 MG/2ML IJ SOLN
INTRAMUSCULAR | Status: AC
  Filled 2019-06-07: qty 2

## 2019-06-07 MED ORDER — DIPHENHYDRAMINE HCL 25 MG PO CAPS: 25.0000 mg | ORAL_CAPSULE | Freq: Every evening | ORAL | Status: DC | PRN

## 2019-06-07 MED ORDER — HYDROMORPHONE 1 MG/ML IV SOLN
INTRAVENOUS | Status: AC
  Administered 2019-06-07: 30 mg via INTRAVENOUS
  Filled 2019-06-07: qty 30

## 2019-06-07 MED ORDER — CEFAZOLIN SODIUM-DEXTROSE 2-4 GM/100ML-% IV SOLN
2.0000 g | Freq: Three times a day (TID) | INTRAVENOUS | Status: AC
  Administered 2019-06-07 – 2019-06-08 (×2): 2 g via INTRAVENOUS
  Filled 2019-06-07 (×2): qty 100

## 2019-06-07 MED ORDER — OXYCODONE HCL 5 MG/5ML PO SOLN: 5.0000 mg | Freq: Once | ORAL | Status: DC | PRN

## 2019-06-07 MED ORDER — PHENYLEPHRINE 40 MCG/ML (10ML) SYRINGE FOR IV PUSH (FOR BLOOD PRESSURE SUPPORT)
PREFILLED_SYRINGE | INTRAVENOUS | Status: AC
  Filled 2019-06-07: qty 10

## 2019-06-07 MED ORDER — ONDANSETRON HCL 4 MG/2ML IJ SOLN: 4.0000 mg | Freq: Four times a day (QID) | INTRAMUSCULAR | Status: DC | PRN

## 2019-06-07 MED ORDER — SODIUM CHLORIDE 0.9 % IV SOLN: INTRAVENOUS | Status: DC

## 2019-06-07 MED ORDER — OXYCODONE HCL 5 MG PO TABS: 5.0000 mg | ORAL_TABLET | Freq: Once | ORAL | Status: DC | PRN

## 2019-06-07 MED ORDER — SUFENTANIL CITRATE 50 MCG/ML IV SOLN
INTRAVENOUS | Status: DC | PRN
  Administered 2019-06-07: 5 ug via INTRAVENOUS
  Administered 2019-06-07 (×2): 10 ug via INTRAVENOUS
  Administered 2019-06-07: 15 ug via INTRAVENOUS
  Administered 2019-06-07: 10 ug via INTRAVENOUS

## 2019-06-07 MED ORDER — PROPOFOL 10 MG/ML IV BOLUS
INTRAVENOUS | Status: DC | PRN
  Administered 2019-06-07: 200 mg via INTRAVENOUS

## 2019-06-07 MED ORDER — SODIUM CHLORIDE (PF) 0.9 % IJ SOLN
INTRAMUSCULAR | Status: AC
  Filled 2019-06-07: qty 10

## 2019-06-07 MED ORDER — BUPIVACAINE HCL 0.5 % IJ SOLN
INTRAMUSCULAR | Status: DC | PRN
  Administered 2019-06-07: 30 mL

## 2019-06-07 MED ORDER — MIDAZOLAM HCL 5 MG/5ML IJ SOLN
INTRAMUSCULAR | Status: DC | PRN
  Administered 2019-06-07: 2 mg via INTRAVENOUS

## 2019-06-07 MED ORDER — ROCURONIUM BROMIDE 10 MG/ML (PF) SYRINGE
PREFILLED_SYRINGE | INTRAVENOUS | Status: AC
  Filled 2019-06-07: qty 10

## 2019-06-07 MED ORDER — OXYCODONE HCL 5 MG PO TABS
5.0000 mg | ORAL_TABLET | ORAL | Status: DC | PRN
  Administered 2019-06-08 (×2): 10 mg via ORAL
  Administered 2019-06-09: 5 mg via ORAL
  Administered 2019-06-10 (×4): 10 mg via ORAL
  Filled 2019-06-07 (×3): qty 2
  Filled 2019-06-07: qty 1
  Filled 2019-06-07 (×3): qty 2

## 2019-06-07 MED ORDER — ROSUVASTATIN CALCIUM 5 MG PO TABS
5.0000 mg | ORAL_TABLET | Freq: Every day | ORAL | Status: DC
  Administered 2019-06-08 – 2019-06-11 (×4): 5 mg via ORAL
  Filled 2019-06-07 (×4): qty 1

## 2019-06-07 MED ORDER — ESCITALOPRAM OXALATE 10 MG PO TABS
20.0000 mg | ORAL_TABLET | Freq: Every day | ORAL | Status: DC
  Administered 2019-06-08 – 2019-06-11 (×4): 20 mg via ORAL
  Filled 2019-06-07 (×6): qty 2

## 2019-06-07 MED ORDER — 0.9 % SODIUM CHLORIDE (POUR BTL) OPTIME
TOPICAL | Status: DC | PRN
  Administered 2019-06-07: 3000 mL

## 2019-06-07 MED ORDER — CEFAZOLIN SODIUM-DEXTROSE 2-4 GM/100ML-% IV SOLN
2.0000 g | INTRAVENOUS | Status: AC
  Administered 2019-06-07: 2 g via INTRAVENOUS
  Filled 2019-06-07: qty 100

## 2019-06-07 MED ORDER — CHLORHEXIDINE GLUCONATE CLOTH 2 % EX PADS
6.0000 | MEDICATED_PAD | Freq: Every day | CUTANEOUS | Status: DC
  Administered 2019-06-07 – 2019-06-08 (×2): 6 via TOPICAL

## 2019-06-07 MED ORDER — ONDANSETRON HCL 4 MG/2ML IJ SOLN
INTRAMUSCULAR | Status: AC
  Filled 2019-06-07: qty 2

## 2019-06-07 MED ORDER — SENNOSIDES-DOCUSATE SODIUM 8.6-50 MG PO TABS
1.0000 | ORAL_TABLET | Freq: Every day | ORAL | Status: DC
  Administered 2019-06-07 – 2019-06-08 (×2): 1 via ORAL
  Filled 2019-06-07 (×3): qty 1

## 2019-06-07 MED ORDER — HYDROMORPHONE 1 MG/ML IV SOLN
INTRAVENOUS | Status: DC
  Administered 2019-06-07: 12:00:00 30 mg via INTRAVENOUS
  Administered 2019-06-07: 1.2 mg via INTRAVENOUS
  Administered 2019-06-08: 2.6 mg via INTRAVENOUS
  Administered 2019-06-08: 1.8 mg via INTRAVENOUS

## 2019-06-07 MED ORDER — SODIUM CHLORIDE 0.9 % IV SOLN: INTRAVENOUS | Status: DC | PRN

## 2019-06-07 MED ORDER — FENTANYL CITRATE (PF) 100 MCG/2ML IJ SOLN
INTRAMUSCULAR | Status: AC
  Administered 2019-06-07: 50 ug via INTRAVENOUS
  Filled 2019-06-07: qty 2

## 2019-06-07 MED ORDER — ROCURONIUM BROMIDE 10 MG/ML (PF) SYRINGE
PREFILLED_SYRINGE | INTRAVENOUS | Status: DC | PRN
  Administered 2019-06-07: 15 mg via INTRAVENOUS
  Administered 2019-06-07: 30 mg via INTRAVENOUS
  Administered 2019-06-07: 20 mg via INTRAVENOUS
  Administered 2019-06-07: 55 mg via INTRAVENOUS

## 2019-06-07 MED ORDER — BUPIVACAINE HCL (PF) 0.5 % IJ SOLN
INTRAMUSCULAR | Status: AC
  Filled 2019-06-07: qty 30

## 2019-06-07 MED ORDER — LACTATED RINGERS IV SOLN
INTRAVENOUS | Status: DC | PRN
  Administered 2019-06-07 (×3): via INTRAVENOUS

## 2019-06-07 MED ORDER — ACETAMINOPHEN 500 MG PO TABS
1000.0000 mg | ORAL_TABLET | Freq: Four times a day (QID) | ORAL | Status: DC
  Administered 2019-06-08 – 2019-06-09 (×3): 1000 mg via ORAL
  Filled 2019-06-07 (×6): qty 2

## 2019-06-07 SURGICAL SUPPLY — 92 items
BLADE CLIPPER SURG (BLADE) IMPLANT
CANISTER SUCT 3000ML PPV (MISCELLANEOUS) ×6 IMPLANT
CATH THORACIC 28FR (CATHETERS) ×3 IMPLANT
CATH THORACIC 36FR (CATHETERS) IMPLANT
CATH THORACIC 36FR RT ANG (CATHETERS) IMPLANT
CLIP VESOCCLUDE MED 6/CT (CLIP) ×3 IMPLANT
CONN ST 1/4X3/8  BEN (MISCELLANEOUS)
CONN ST 1/4X3/8 BEN (MISCELLANEOUS) IMPLANT
CONN Y 3/8X3/8X3/8  BEN (MISCELLANEOUS)
CONN Y 3/8X3/8X3/8 BEN (MISCELLANEOUS) IMPLANT
CONT SPEC 4OZ CLIKSEAL STRL BL (MISCELLANEOUS) ×21 IMPLANT
COVER SURGICAL LIGHT HANDLE (MISCELLANEOUS) ×3 IMPLANT
COVER WAND RF STERILE (DRAPES) ×3 IMPLANT
DEFOGGER SCOPE WARMER CLEARIFY (MISCELLANEOUS) ×3 IMPLANT
DERMABOND ADHESIVE PROPEN (GAUZE/BANDAGES/DRESSINGS) ×1
DERMABOND ADVANCED (GAUZE/BANDAGES/DRESSINGS) ×1
DERMABOND ADVANCED .7 DNX12 (GAUZE/BANDAGES/DRESSINGS) ×2 IMPLANT
DERMABOND ADVANCED .7 DNX6 (GAUZE/BANDAGES/DRESSINGS) ×2 IMPLANT
DRAIN CHANNEL 28F RND 3/8 FF (WOUND CARE) IMPLANT
DRAIN CHANNEL 32F RND 10.7 FF (WOUND CARE) IMPLANT
DRAPE CV SPLIT W-CLR ANES SCRN (DRAPES) ×3 IMPLANT
DRAPE ORTHO SPLIT 77X108 STRL (DRAPES) ×1
DRAPE SURG ORHT 6 SPLT 77X108 (DRAPES) ×2 IMPLANT
DRAPE WARM FLUID 44X44 (DRAPES) ×3 IMPLANT
ELECT BLADE 6.5 EXT (BLADE) ×3 IMPLANT
ELECT REM PT RETURN 9FT ADLT (ELECTROSURGICAL) ×3
ELECTRODE REM PT RTRN 9FT ADLT (ELECTROSURGICAL) ×2 IMPLANT
GAUZE SPONGE 4X4 12PLY STRL (GAUZE/BANDAGES/DRESSINGS) IMPLANT
GAUZE SPONGE 4X4 12PLY STRL LF (GAUZE/BANDAGES/DRESSINGS) ×3 IMPLANT
GLOVE SURG SIGNA 7.5 PF LTX (GLOVE) ×6 IMPLANT
GOWN STRL REUS W/ TWL LRG LVL3 (GOWN DISPOSABLE) ×4 IMPLANT
GOWN STRL REUS W/ TWL XL LVL3 (GOWN DISPOSABLE) ×4 IMPLANT
GOWN STRL REUS W/TWL LRG LVL3 (GOWN DISPOSABLE) ×2
GOWN STRL REUS W/TWL XL LVL3 (GOWN DISPOSABLE) ×2
HEMOSTAT SURGICEL 2X14 (HEMOSTASIS) IMPLANT
KIT BASIN OR (CUSTOM PROCEDURE TRAY) ×3 IMPLANT
KIT SUCTION CATH 14FR (SUCTIONS) ×3 IMPLANT
KIT TURNOVER KIT B (KITS) ×3 IMPLANT
NEEDLE HYPO 25GX1X1/2 BEV (NEEDLE) ×3 IMPLANT
NEEDLE SPNL 18GX3.5 QUINCKE PK (NEEDLE) IMPLANT
NS IRRIG 1000ML POUR BTL (IV SOLUTION) ×9 IMPLANT
PACK CHEST (CUSTOM PROCEDURE TRAY) ×3 IMPLANT
PAD ARMBOARD 7.5X6 YLW CONV (MISCELLANEOUS) ×6 IMPLANT
POUCH ENDO CATCH II 15MM (MISCELLANEOUS) IMPLANT
POUCH SPECIMEN RETRIEVAL 10MM (ENDOMECHANICALS) ×3 IMPLANT
SCISSORS LAP 5X35 DISP (ENDOMECHANICALS) IMPLANT
SEALANT PROGEL (MISCELLANEOUS) IMPLANT
SEALANT SURG COSEAL 4ML (VASCULAR PRODUCTS) IMPLANT
SEALANT SURG COSEAL 8ML (VASCULAR PRODUCTS) IMPLANT
SHEARS HARMONIC HDI 20CM (ELECTROSURGICAL) IMPLANT
SOL ANTI FOG 6CC (MISCELLANEOUS) ×2 IMPLANT
SOLUTION ANTI FOG 6CC (MISCELLANEOUS) ×1
SPECIMEN JAR MEDIUM (MISCELLANEOUS) ×3 IMPLANT
SPONGE INTESTINAL PEANUT (DISPOSABLE) ×9 IMPLANT
SPONGE TONSIL TAPE 1 RFD (DISPOSABLE) ×3 IMPLANT
STAPLE RELOAD 45MM GOLD (STAPLE) ×21 IMPLANT
STOPCOCK 4 WAY LG BORE MALE ST (IV SETS) ×3 IMPLANT
SUT PROLENE 4 0 RB 1 (SUTURE)
SUT PROLENE 4-0 RB1 .5 CRCL 36 (SUTURE) IMPLANT
SUT SILK  1 MH (SUTURE) ×6
SUT SILK 1 MH (SUTURE) ×12 IMPLANT
SUT SILK 1 TIES 10X30 (SUTURE) ×3 IMPLANT
SUT SILK 2 0 SH (SUTURE) IMPLANT
SUT SILK 2 0SH CR/8 30 (SUTURE) IMPLANT
SUT SILK 3 0 SH 30 (SUTURE) IMPLANT
SUT SILK 3 0SH CR/8 30 (SUTURE) ×3 IMPLANT
SUT VIC AB 0 CTX 27 (SUTURE) IMPLANT
SUT VIC AB 1 CTX 27 (SUTURE) ×3 IMPLANT
SUT VIC AB 2-0 CT1 27 (SUTURE)
SUT VIC AB 2-0 CT1 TAPERPNT 27 (SUTURE) IMPLANT
SUT VIC AB 2-0 CTX 36 (SUTURE) ×3 IMPLANT
SUT VIC AB 3-0 MH 27 (SUTURE) IMPLANT
SUT VIC AB 3-0 SH 27 (SUTURE)
SUT VIC AB 3-0 SH 27X BRD (SUTURE) IMPLANT
SUT VIC AB 3-0 X1 27 (SUTURE) ×3 IMPLANT
SUT VICRYL 0 UR6 27IN ABS (SUTURE) IMPLANT
SUT VICRYL 2 TP 1 (SUTURE) IMPLANT
SYR 10ML LL (SYRINGE) ×3 IMPLANT
SYR 20ML LL LF (SYRINGE) ×3 IMPLANT
SYR 30ML LL (SYRINGE) ×3 IMPLANT
SYR 50ML LL SCALE MARK (SYRINGE) ×3 IMPLANT
SYSTEM SAHARA CHEST DRAIN ATS (WOUND CARE) ×3 IMPLANT
TAPE CLOTH SURG 4X10 WHT LF (GAUZE/BANDAGES/DRESSINGS) ×3 IMPLANT
TIP APPLICATOR SPRAY EXTEND 16 (VASCULAR PRODUCTS) IMPLANT
TOWEL GREEN STERILE (TOWEL DISPOSABLE) ×3 IMPLANT
TOWEL GREEN STERILE FF (TOWEL DISPOSABLE) ×3 IMPLANT
TRAY FOLEY MTR SLVR 16FR STAT (SET/KITS/TRAYS/PACK) ×3 IMPLANT
TRAY FOLEY SLVR 16FR LF STAT (SET/KITS/TRAYS/PACK) ×3 IMPLANT
TROCAR XCEL BLADELESS 5X75MML (TROCAR) ×3 IMPLANT
TROCAR XCEL NON-BLD 5MMX100MML (ENDOMECHANICALS) IMPLANT
TUBING EXTENTION W/L.L. (IV SETS) ×3 IMPLANT
WATER STERILE IRR 1000ML POUR (IV SOLUTION) ×6 IMPLANT

## 2019-06-07 NOTE — Anesthesia Procedure Notes (Signed)
Central Venous Catheter Insertion Performed by: Albertha Ghee, MD, anesthesiologist Start/End12/12/2018 6:52 AM, 06/07/2019 7:03 AM Patient location: Pre-op. Preanesthetic checklist: patient identified, IV checked, site marked, risks and benefits discussed, surgical consent, monitors and equipment checked, pre-op evaluation, timeout performed and anesthesia consent Position: Trendelenburg Lidocaine 1% used for infiltration and patient sedated Hand hygiene performed , maximum sterile barriers used  and Seldinger technique used Catheter size: 7 Fr Central line was placed.Double lumen Procedure performed using ultrasound guided technique. Ultrasound Notes:anatomy identified, needle tip was noted to be adjacent to the nerve/plexus identified and no ultrasound evidence of intravascular and/or intraneural injection Attempts: 1 Following insertion, line sutured, dressing applied and Biopatch. Post procedure assessment: blood return through all ports, free fluid flow and no air  Patient tolerated the procedure well with no immediate complications.

## 2019-06-07 NOTE — Anesthesia Postprocedure Evaluation (Signed)
Anesthesia Post Note  Patient: Patricia [name]  Procedure(s) Performed: VIDEO ASSISTED THORACOSCOPY (VATS)/WEDGE RESECTION (Left Chest)     Patient location during evaluation: PACU Anesthesia Type: General Level of consciousness: awake and alert Pain management: pain level controlled Vital Signs Assessment: post-procedure vital signs reviewed and stable Respiratory status: spontaneous breathing, nonlabored ventilation, respiratory function stable and patient connected to nasal cannula oxygen Cardiovascular status: blood pressure returned to baseline and stable Postop Assessment: no apparent nausea or vomiting Anesthetic complications: no    Last Vitals:  Vitals:   06/07/19 1200 06/07/19 1230  BP: 120/81 125/72  Pulse: 83 95  Resp: 16 (!) 27  Temp:    SpO2: 98% 96%    Last Pain:  Vitals:   06/07/19 1230  TempSrc:   PainSc: [place] Brock

## 2019-06-07 NOTE — Transfer of Care (Signed)
Immediate Anesthesia Transfer of Care Note  Patient: Patricia [name]  Procedure(s) Performed: VIDEO ASSISTED THORACOSCOPY (VATS)/WEDGE RESECTION (Left Chest)  Patient Location: PACU  Anesthesia Type:General  Level of Consciousness: awake and patient cooperative  Airway & Oxygen Therapy: Patient Spontanous Breathing and Patient connected to nasal cannula oxygen  Post-op Assessment: Report given to RN, Post -op Vital signs reviewed and stable and Patient moving all extremities  Post vital signs: Reviewed and stable  Last Vitals:  Vitals Value Taken Time  BP 119/76 06/07/19 1100  Temp    Pulse 96 06/07/19 1101  Resp 13 06/07/19 1101  SpO2 97 % 06/07/19 1101  Vitals shown include unvalidated device data.  Last Pain:  Vitals:   06/07/19 0631  TempSrc:   PainSc: 0-No pain         Complications: No apparent anesthesia complications

## 2019-06-07 NOTE — Brief Op Note (Addendum)
06/07/2019  10:47 AM  PATIENT:  Patricia [name]  58 y.o. female  PRE-OPERATIVE DIAGNOSIS:  LUL LUNG NODULE  POST-OPERATIVE DIAGNOSIS:  LUL LUNG NODULE- fibrosis  PROCEDURE:  Procedure(s): VIDEO ASSISTED THORACOSCOPY (VATS WEDGE RESECTION LEFT UPPER LOBE INTERCOSTAL NERVE BLOCKS- LEVELS #-10  SURGEON:  [name], MD - Primary  PHYSICIAN ASSISTANT: Enid Cutter, PA  ANESTHESIA:   general  EBL:  50 mL   BLOOD ADMINISTERED:none  DRAINS: 74fr. left pleural tube   LOCAL MEDICATIONS USED:  BUPIVICAINE   SPECIMEN:  Source of Specimen:  Left upper lobe wedge  DISPOSITION OF SPECIMEN:  PATHOLOGY  COUNTS:  YES  DICTATION: .Dragon Dictation  PLAN OF CARE: Admit to inpatient   PATIENT DISPOSITION:  PACU - hemodynamically stable.   Delay start of Pharmacological VTE agent (>24hrs) due to surgical blood loss or risk of bleeding: yes

## 2019-06-07 NOTE — Progress Notes (Signed)
Received patient from PACU s/p VATS.  Patient oriented to room, call bell and policies.  Chest tume to -20cm draining bright red blood.  F/C in place draining clear yellow urine. Please see full assessment for details.

## 2019-06-07 NOTE — Anesthesia Procedure Notes (Signed)
Arterial Line Insertion Start/End12/12/2018 7:00 AM, 06/07/2019 7:10 AM Performed by: Moshe Salisbury, CRNA, CRNA  Patient location: Pre-op. Preanesthetic checklist: patient identified, IV checked, site marked, risks and benefits discussed, surgical consent, monitors and equipment checked, pre-op evaluation, timeout performed and anesthesia consent Lidocaine 1% used for infiltration and patient sedated Left, radial was placed Catheter size: 20 G Hand hygiene performed , maximum sterile barriers used  and Seldinger technique used  Attempts: 1 Procedure performed without using ultrasound guided technique. Following insertion, dressing applied and Biopatch. Post procedure assessment: normal  Patient tolerated the procedure well with no immediate complications.

## 2019-06-07 NOTE — Anesthesia Procedure Notes (Signed)
Procedure Name: Intubation Date/Time: 06/07/2019 7:45 AM Performed by: Moshe Salisbury, CRNA Pre-anesthesia Checklist: Patient identified, Emergency Drugs available, Suction available and Patient being monitored Patient Re-evaluated:Patient Re-evaluated prior to induction Oxygen Delivery Method: Circle System Utilized Preoxygenation: Pre-oxygenation with 100% oxygen Induction Type: IV induction Ventilation: Mask ventilation without difficulty Laryngoscope Size: Mac and 3 Grade View: Grade II Endobronchial tube: Left, Double lumen EBT and EBT position confirmed by fiberoptic bronchoscope and 37 Fr Number of attempts: 1 Airway Equipment and Method: Stylet Placement Confirmation: ETT inserted through vocal cords under direct vision,  positive ETCO2 and breath sounds checked- equal and bilateral Secured at: 27 cm Tube secured with: Tape Dental Injury: Teeth and Oropharynx as per pre-operative assessment

## 2019-06-07 NOTE — Interval H&P Note (Signed)
History and Physical Interval Note:  Met with Rad Onc and has decided to proceed with resection 06/07/2019 7:25 AM  Patricia [name]  has presented today for surgery, with the diagnosis of LUL LUNG NODULE.  The various methods of treatment have been discussed with the patient and family. After consideration of risks, benefits and other options for treatment, the patient has consented to  Procedure(s): VIDEO ASSISTED THORACOSCOPY (VATS)/WEDGE RESECTION (Left) possible SEGMENTECTOMY (Left) LOBECTOMY (Left) as a surgical intervention.  The patient's history has been reviewed, patient examined, no change in status, stable for surgery.  I have reviewed the patient's chart and labs.  Questions were answered to the patient's satisfaction.     [name]

## 2019-06-07 NOTE — Op Note (Signed)
NAME: Patricia [name], Patricia [name] MEDICAL RECORD OI:[id number] ACCOUNT [id number] DATE OF BIRTH:10-29-1960 FACILITY: [facility] LOCATION: [facility] PHYSICIAN:Krzysztof Reichelt Chaya Jan, MD  OPERATIVE REPORT  DATE OF PROCEDURE:  06/07/2019  PREOPERATIVE DIAGNOSIS:  Left upper lobe lung nodule.  POSTOPERATIVE DIAGNOSES:   1.  Fibrotic nodule.   2.  No malignancy.  PROCEDURES:   1.  Left video-assisted thoracoscopy.  2.  Wedge resection of left upper lobe nodule.  3.  Intercostal nerve block levels 3 through 10.  SURGEON:  Modesto Charon, MD  ASSISTANT:  Enid Cutter, PA-C  ANESTHESIA:  General.  FINDINGS:  A nodule on the midportion of left upper lobe along the major fissure with visceral pleural invagination.  Frozen section revealed fibrotic nodule.  CLINICAL NOTE:  The patient is a 58 year old woman with a history of tobacco abuse who was recently evaluated for a chronic cough.  A low-dose screening CT was done and showed a 6 mm nodule in the left upper lobe along the major fissure.  On PET CT,  there was no uptake, but on the CT, there were signs of retraction of the visceral pleura in the fissure.  The patient was offered the options of continued radiographic observation versus surgical resection.  She favored an aggressive approach.  The  indications, risks, benefits and alternatives were discussed in detail with the patient.  She understood and accepted the risks and agreed to proceed.  OPERATIVE NOTE:  The patient was brought to the preoperative holding area on 06/07/2019.  Anesthesia placed a central line and an arterial blood pressure monitoring line.  She was taken to the operating room, anesthetized and intubated with a double lumen  endotracheal tube.  Intravenous antibiotics were administered.  A Foley catheter was placed.  Sequential compression devices were placed on the calves for DVT prophylaxis.  She was placed in a right lateral decubitus position and the left chest was   prepped and draped in the usual sterile fashion.  Single-lung ventilation of the right lung was initiated and was tolerated well throughout the procedure.  A timeout was performed.  A solution containing 20 mL of liposomal bupivacaine, 30 mL of 0.5% bupivacaine and 50 mL of saline was prepared.  This was used for local at the incisions, as well as for the intercostal nerve blocks.  An incision was made in  the 8th interspace in the mid axillary line.  A 5 mm port was inserted into the chest.  The thoracoscope was advanced into the chest.  Initially, there was some difficulty with isolating the left lung.  It was relatively slow to deflate, but ultimately  with some readjustments of the double lumen endotracheal tube, there was good isolation and no cross ventilation.  A 5 cm working incision was made in the 4th interspace anterolaterally.  No rib spreading was performed during the procedure.  There was a  nodule clearly visible on the visceral pleura in the major fissure of the left upper lobe consistent with the nodule seen on CT.  There was some invagination of the visceral pleura at this site.  In order to get a good wedge resection without  compromising a potential segmentectomy, the fissure was dissected out.  Anteriorly, the fissure was completed with sequential firings of an Echelon 45 mm stapler using gold cartridges.  The pulmonary artery was identified in the fissure more laterally.   The fissure was complete and the pleura overlying the pulmonary artery was incised.  This allowed adequate mobilization of the nodule so  that the wedge resection could be performed without compromising the pulmonary arterial vessels.  This was done with  sequential firing of Echelon stapler using gold cartridges.  The specimen was placed into an endoscopic retrieval bag, removed and sent for frozen section.  Because the nodule was suspicious, the pleural reflection was divided at the hilum  circumferentially.   Level 5 and 6 lymph nodes were dissected out, as well as a level 11 node in the fissure.  A level 9 node also was identified.  At this point before any further dissection was performed, intercostal nerve blocks were performed from the  3rd to the 10th interspace.  Ten mL of the liposomal bupivacaine solution was injected into a subpleural location at each level.  The frozen section returned showing a fibrotic nodule.  There was no malignancy.  Final inspection was made for hemostasis.   A 28-French chest tube was placed through the original port incision and secured with a #1 silk suture.  The upper lobe was inspected.  No other nodules were identified.  Dual-lung ventilation was resumed.  The working incision was closed in 3 layers.   Dermabond was applied to the skin.  The chest tube was placed to suction.  The patient was placed back in supine position.  She then was extubated in the operating room and taken to the postanesthetic care unit in good condition.  VN/NUANCE  D:06/07/2019 T:06/07/2019 JOB:[id number]

## 2019-06-08 ENCOUNTER — Inpatient Hospital Stay (HOSPITAL_COMMUNITY): Payer: BC Managed Care – PPO

## 2019-06-08 ENCOUNTER — Encounter (HOSPITAL_COMMUNITY): Payer: Self-pay | Admitting: Thoracic Surgery (Cardiothoracic Vascular Surgery)

## 2019-06-08 LAB — BASIC METABOLIC PANEL
Anion gap: 12 (ref 5–15)
BUN: 8 mg/dL (ref 6–20)
CO2: 25 mmol/L (ref 22–32)
Calcium: 8.8 mg/dL — ABNORMAL LOW (ref 8.9–10.3)
Chloride: 100 mmol/L (ref 98–111)
Creatinine, Ser: 0.7 mg/dL (ref 0.44–1.00)
GFR calc Af Amer: >60 mL/min (ref >60)
GFR calc non Af Amer: >60 mL/min (ref >60)
Glucose, Bld: 120 mg/dL — ABNORMAL HIGH (ref 70–99)
Potassium: 4 mmol/L (ref 3.5–5.1)
Sodium: 137 mmol/L (ref 135–145)

## 2019-06-08 LAB — SURGICAL PATHOLOGY

## 2019-06-08 LAB — CBC
HCT: 36.8 % (ref 36.0–46.0)
Hemoglobin: 12.2 g/dL (ref 12.0–15.0)
MCH: 32 pg (ref 26.0–34.0)
MCHC: 33.2 g/dL (ref 30.0–36.0)
MCV: 96.6 fL (ref 80.0–100.0)
Platelets: 261 10*3/uL (ref 150–400)
RBC: 3.81 MIL/uL — ABNORMAL LOW (ref 3.87–5.11)
RDW: 12.9 % (ref 11.5–15.5)
WBC: 17.7 10*3/uL — ABNORMAL HIGH (ref 4.0–10.5)
nRBC: 0 % (ref 0.0–0.2)

## 2019-06-08 MED ORDER — ONDANSETRON HCL 4 MG/2ML IJ SOLN: 4.0000 mg | Freq: Four times a day (QID) | INTRAMUSCULAR | Status: DC | PRN

## 2019-06-08 MED ORDER — METOCLOPRAMIDE HCL 5 MG/ML IJ SOLN
10.0000 mg | Freq: Four times a day (QID) | INTRAMUSCULAR | Status: DC
  Administered 2019-06-08 – 2019-06-09 (×3): 10 mg via INTRAVENOUS
  Filled 2019-06-08 (×3): qty 2

## 2019-06-08 MED ORDER — KETOROLAC TROMETHAMINE 30 MG/ML IJ SOLN
30.0000 mg | Freq: Four times a day (QID) | INTRAMUSCULAR | Status: DC | PRN
  Administered 2019-06-08: 30 mg via INTRAVENOUS
  Filled 2019-06-08: qty 1

## 2019-06-08 MED ORDER — FENTANYL 40 MCG/ML IV SOLN
INTRAVENOUS | Status: DC
  Administered 2019-06-08: 1000 ug via INTRAVENOUS
  Administered 2019-06-08: 66.89 ug via INTRAVENOUS
  Administered 2019-06-09: 120 ug via INTRAVENOUS
  Administered 2019-06-09: 45 ug via INTRAVENOUS
  Administered 2019-06-09: 90 ug via INTRAVENOUS
  Filled 2019-06-08: qty 25

## 2019-06-08 MED ORDER — ADULT MULTIVITAMIN W/MINERALS CH: 1.0000 | ORAL_TABLET | Freq: Every day | ORAL | Status: DC

## 2019-06-08 MED ORDER — VITAMIN B-1 100 MG PO TABS: 100.0000 mg | ORAL_TABLET | Freq: Every day | ORAL | Status: DC

## 2019-06-08 MED ORDER — HYDROXYZINE HCL 25 MG PO TABS
25.0000 mg | ORAL_TABLET | Freq: Four times a day (QID) | ORAL | Status: AC | PRN
  Administered 2019-06-08: 25 mg via ORAL
  Filled 2019-06-08: qty 1

## 2019-06-08 MED ORDER — LOPERAMIDE HCL 2 MG PO CAPS: 2.0000 mg | ORAL_CAPSULE | ORAL | Status: AC | PRN

## 2019-06-08 MED ORDER — DIPHENHYDRAMINE HCL 50 MG/ML IJ SOLN: 12.5000 mg | Freq: Four times a day (QID) | INTRAMUSCULAR | Status: DC | PRN

## 2019-06-08 MED ORDER — MELATONIN 3 MG PO TABS
3.0000 mg | ORAL_TABLET | Freq: Every day | ORAL | Status: DC
  Administered 2019-06-08 – 2019-06-10 (×3): 3 mg via ORAL
  Filled 2019-06-08 (×4): qty 1

## 2019-06-08 MED ORDER — CHLORDIAZEPOXIDE HCL 25 MG PO CAPS
25.0000 mg | ORAL_CAPSULE | Freq: Three times a day (TID) | ORAL | Status: AC
  Administered 2019-06-09 (×3): 25 mg via ORAL
  Filled 2019-06-08 (×3): qty 1

## 2019-06-08 MED ORDER — DIPHENHYDRAMINE HCL 12.5 MG/5ML PO ELIX: 25.0000 mg | ORAL_SOLUTION | Freq: Four times a day (QID) | ORAL | Status: DC | PRN

## 2019-06-08 MED ORDER — LORAZEPAM 2 MG/ML IJ SOLN
1.0000 mg | INTRAMUSCULAR | Status: DC | PRN
  Administered 2019-06-08: 2 mg via INTRAVENOUS
  Administered 2019-06-09: 1 mg via INTRAVENOUS
  Administered 2019-06-09 – 2019-06-11 (×4): 2 mg via INTRAVENOUS
  Filled 2019-06-08 (×7): qty 1

## 2019-06-08 MED ORDER — THIAMINE HCL 100 MG/ML IJ SOLN
100.0000 mg | Freq: Once | INTRAMUSCULAR | Status: AC
  Administered 2019-06-08: 100 mg via INTRAMUSCULAR
  Filled 2019-06-08: qty 2

## 2019-06-08 MED ORDER — CHLORDIAZEPOXIDE HCL 25 MG PO CAPS: 25.0000 mg | ORAL_CAPSULE | Freq: Four times a day (QID) | ORAL | Status: AC | PRN

## 2019-06-08 MED ORDER — CHLORDIAZEPOXIDE HCL 5 MG PO CAPS
25.0000 mg | ORAL_CAPSULE | Freq: Four times a day (QID) | ORAL | Status: AC
  Administered 2019-06-08 (×4): 25 mg via ORAL
  Filled 2019-06-08 (×4): qty 5

## 2019-06-08 MED ORDER — DIPHENHYDRAMINE HCL 12.5 MG/5ML PO ELIX
12.5000 mg | ORAL_SOLUTION | Freq: Four times a day (QID) | ORAL | Status: DC | PRN
  Filled 2019-06-08: qty 5

## 2019-06-08 MED ORDER — SPIRITUS FRUMENTI
1.0000 | ORAL | Status: DC | PRN
  Filled 2019-06-08: qty 1

## 2019-06-08 MED ORDER — CHLORDIAZEPOXIDE HCL 25 MG PO CAPS
25.0000 mg | ORAL_CAPSULE | Freq: Every day | ORAL | Status: AC
  Administered 2019-06-11: 25 mg via ORAL
  Filled 2019-06-08: qty 1

## 2019-06-08 MED ORDER — ADULT MULTIVITAMIN W/MINERALS CH
1.0000 | ORAL_TABLET | Freq: Every day | ORAL | Status: DC
  Administered 2019-06-08 – 2019-06-11 (×4): 1 via ORAL
  Filled 2019-06-08 (×4): qty 1

## 2019-06-08 MED ORDER — NALOXONE HCL 0.4 MG/ML IJ SOLN: 0.4000 mg | INTRAMUSCULAR | Status: DC | PRN

## 2019-06-08 MED ORDER — SODIUM CHLORIDE 0.9% FLUSH: 9.0000 mL | INTRAVENOUS | Status: DC | PRN

## 2019-06-08 MED ORDER — CHLORDIAZEPOXIDE HCL 25 MG PO CAPS
25.0000 mg | ORAL_CAPSULE | ORAL | Status: AC
  Administered 2019-06-10 (×2): 25 mg via ORAL
  Filled 2019-06-08 (×2): qty 1

## 2019-06-08 MED ORDER — VITAMIN B-1 100 MG PO TABS
100.0000 mg | ORAL_TABLET | Freq: Every day | ORAL | Status: DC
  Administered 2019-06-09 – 2019-06-11 (×3): 100 mg via ORAL
  Filled 2019-06-08 (×3): qty 1

## 2019-06-08 MED ORDER — FOLIC ACID 1 MG PO TABS
1.0000 mg | ORAL_TABLET | Freq: Every day | ORAL | Status: DC
  Administered 2019-06-08 – 2019-06-11 (×4): 1 mg via ORAL
  Filled 2019-06-08 (×4): qty 1

## 2019-06-08 MED ORDER — DIPHENHYDRAMINE HCL 50 MG/ML IJ SOLN
25.0000 mg | Freq: Four times a day (QID) | INTRAMUSCULAR | Status: DC | PRN
  Administered 2019-06-08: 25 mg via INTRAVENOUS
  Filled 2019-06-08: qty 1

## 2019-06-08 NOTE — Progress Notes (Signed)
Pt c/o severe pain at CT site. She states "my body is rejecting the tube. It is coming out one way or another." The patient has received all PRN mediation for pain and has been utilizing her PCA. RN attempted to educate the patient on the purpose and importance of the CT remaining in place and the need for her to refrain from removing it herself. The patient refused education and insisted that the tube would be pulled out no matter what. The MD was paged; RN asked to page PA.  PA was paged and message given to OR nurse to be relayed. Awaiting return call.    The CT site was assessed; it is unremarkable and there is no air leak. All VSS. Will continue to closely monitor patient.

## 2019-06-08 NOTE — Progress Notes (Signed)
Contacted via nursing for patient with persistent uncontrolled pain, trying to remove chest tube.    Upon arrival patient is extremely agitated and uncomfortable.  She states that her body is rejecting the chest tube or she has a blood clot.  She states she has been give enough medicine to tranquilize a horse, however Demerol has worked for her in the past.  Of note she is an alcoholic.     Gen:  Patient clearly upset, crying, agitated, uncomfortable Heart: RRR Lungs: CTA bilaterally, sats 96 Chest tube site- no sub q air present, no air leak from chest tube on water seal   A/P:  Patient is having uncontrolled pain, likely coupled with alcohol withdrawal symptoms.  Will try Toradol for pain control.  Stop Dilaudid PCA and start Fentanyl PCA.  Will start vitamin, thiamine, folic acid, Ativan prn.  Add alcohol beverage as needed...   STAT CXR obtained and showed:  No pneumothorax, but increase in left basilar atelectasis.    Will add flutter valve and encourage better use of IS once patient is calm and pain control can be achieved  Ellwood Handler, PA-C

## 2019-06-08 NOTE — Progress Notes (Signed)
13 cc dilaudid wasted from PCA. Montey Hora, RN witness to waste.

## 2019-06-08 NOTE — Progress Notes (Signed)
1 Day Post-Op Procedure(s) (LRB): VIDEO ASSISTED THORACOSCOPY (VATS)/WEDGE RESECTION (Left) Subjective: Some discomfort, denies nausea  Objective: Vital signs in last 24 hours: Temp:  [98 F (36.7 C)-98.7 F (37.1 C)] 98.7 F (37.1 C) (12/08 0700) Pulse Rate:  [69-103] 76 (12/08 0645) Cardiac Rhythm: Normal sinus rhythm (12/08 0400) Resp:  [10-32] 21 (12/08 0645) BP: (93-132)/(56-113) 125/106 (12/08 0645) SpO2:  [93 %-99 %] 97 % (12/08 0645) Arterial Line BP: (114-127)/(55-64) 124/63 (12/07 1400) Weight:  [36 kg] 69 kg (12/08 0600)  Hemodynamic parameters for last 24 hours:    Intake/Output from previous day: 12/07 0701 - 12/08 0700 In: 3696 [P.O.:600; I.V.:2896.3; IV Piggyback:199.7] Out: 5595 [Urine:4925; Blood:50; Chest Tube:620] Intake/Output this shift: No intake/output data recorded.  General appearance: alert, cooperative and no distress Neurologic: intact Heart: regular rate and rhythm Lungs: clear to auscultation bilaterally no air leak  Lab Results: Recent Labs    06/08/19 0400  WBC 17.7*  HGB 12.2  HCT 36.8  PLT 261   BMET:  Recent Labs    06/08/19 0400  NA 137  K 4.0  CL 100  CO2 25  GLUCOSE 120*  BUN 8  CREATININE 0.70  CALCIUM 8.8*    PT/INR: No results for input(s): LABPROT, INR in the last 72 hours. ABG    Component Value Date/Time   PHART 7.429 06/03/2019 1334   HCO3 24.4 06/03/2019 1334   O2SAT 97.5 06/03/2019 1334   CBG (last 3)  Recent Labs    06/07/19 2354  GLUCAP 145*    Assessment/Plan: S/P Procedure(s) (LRB): VIDEO ASSISTED THORACOSCOPY (VATS)/WEDGE RESECTION (Left) - POD # 1  Looks great transfer to step down, tele Nicotine patch effective for cravings Has significant ethanol use, requesting something for that- will give Librium detox protocol No air leak- CT to water seal Ambulate SCD + enoxaparin for DVT prophylaxis   LOS: 1 day    [name] 06/08/2019

## 2019-06-08 NOTE — Progress Notes (Signed)
Called report to [facility], pt transported on [facility], alert and oriented, and pt reports she is comfortable as is on her right side. RN present at transfer.

## 2019-06-09 ENCOUNTER — Inpatient Hospital Stay (HOSPITAL_COMMUNITY): Payer: BC Managed Care – PPO

## 2019-06-09 LAB — CBC
HCT: 33 % — ABNORMAL LOW (ref 36.0–46.0)
Hemoglobin: 11.2 g/dL — ABNORMAL LOW (ref 12.0–15.0)
MCH: 32.1 pg (ref 26.0–34.0)
MCHC: 33.9 g/dL (ref 30.0–36.0)
MCV: 94.6 fL (ref 80.0–100.0)
Platelets: 202 10*3/uL (ref 150–400)
RBC: 3.49 MIL/uL — ABNORMAL LOW (ref 3.87–5.11)
RDW: 12.8 % (ref 11.5–15.5)
WBC: 11.6 10*3/uL — ABNORMAL HIGH (ref 4.0–10.5)
nRBC: 0 % (ref 0.0–0.2)

## 2019-06-09 LAB — COMPREHENSIVE METABOLIC PANEL
ALT: 39 U/L (ref 0–44)
AST: 34 U/L (ref 15–41)
Albumin: 3.1 g/dL — ABNORMAL LOW (ref 3.5–5.0)
Alkaline Phosphatase: 52 U/L (ref 38–126)
Anion gap: 7 (ref 5–15)
BUN: 15 mg/dL (ref 6–20)
CO2: 29 mmol/L (ref 22–32)
Calcium: 8.5 mg/dL — ABNORMAL LOW (ref 8.9–10.3)
Chloride: 101 mmol/L (ref 98–111)
Creatinine, Ser: 0.87 mg/dL (ref 0.44–1.00)
GFR calc Af Amer: >60 mL/min (ref >60)
GFR calc non Af Amer: >60 mL/min (ref >60)
Glucose, Bld: 124 mg/dL — ABNORMAL HIGH (ref 70–99)
Potassium: 4.2 mmol/L (ref 3.5–5.1)
Sodium: 137 mmol/L (ref 135–145)
Total Bilirubin: 0.4 mg/dL (ref 0.3–1.2)
Total Protein: 5.8 g/dL — ABNORMAL LOW (ref 6.5–8.1)

## 2019-06-09 MED ORDER — KETOROLAC TROMETHAMINE 30 MG/ML IJ SOLN
30.0000 mg | Freq: Four times a day (QID) | INTRAMUSCULAR | Status: AC
  Administered 2019-06-09 – 2019-06-10 (×4): 30 mg via INTRAVENOUS
  Filled 2019-06-09 (×4): qty 1

## 2019-06-09 NOTE — Progress Notes (Signed)
2 Days Post-Op Procedure(s) (LRB): VIDEO ASSISTED THORACOSCOPY (VATS)/WEDGE RESECTION (Left) Subjective: Pain much better this AM Denies nausea  Objective: Vital signs in last 24 hours: Temp:  [98.1 F (36.7 C)-98.7 F (37.1 C)] 98.1 F (36.7 C) (12/09 0741) Pulse Rate:  [74-102] 83 (12/09 0741) Cardiac Rhythm: Normal sinus rhythm (12/09 0746) Resp:  [9-20] 17 (12/09 0741) BP: (102-146)/(64-98) 103/67 (12/09 0741) SpO2:  [92 %-99 %] 96 % (12/09 0741)  Hemodynamic parameters for last 24 hours:    Intake/Output from previous day: 12/08 0701 - 12/09 0700 In: 730 [P.O.:360; I.V.:370] Out: 155 [Urine:75; Chest Tube:80] Intake/Output this shift: No intake/output data recorded.  General appearance: alert, cooperative and no distress Neurologic: intact Heart: regular rate and rhythm Lungs: clear to auscultation bilaterally Abdomen: mildly distended, nontender no air leak  Lab Results: Recent Labs    06/08/19 0400 06/09/19 0154  WBC 17.7* 11.6*  HGB 12.2 11.2*  HCT 36.8 33.0*  PLT 261 202   BMET:  Recent Labs    06/08/19 0400 06/09/19 0154  NA 137 137  K 4.0 4.2  CL 100 101  CO2 25 29  GLUCOSE 120* 124*  BUN 8 15  CREATININE 0.70 0.87  CALCIUM 8.8* 8.5*    PT/INR: No results for input(s): LABPROT, INR in the last 72 hours. ABG    Component Value Date/Time   PHART 7.429 06/03/2019 1334   HCO3 24.4 06/03/2019 1334   O2SAT 97.5 06/03/2019 1334   CBG (last 3)  Recent Labs    06/07/19 2354  GLUCAP 145*    Assessment/Plan: S/P Procedure(s) (LRB): VIDEO ASSISTED THORACOSCOPY (VATS)/WEDGE RESECTION (Left) -Looks good this AM No air leak and minimal drainage from CT- will dc chest tube Pain control- acetaminophen, PCA + toradol Ambulate SCD + enoxaparin for DVT prophylaxis   LOS: 2 days    [name] 06/09/2019

## 2019-06-09 NOTE — Plan of Care (Signed)

## 2019-06-10 ENCOUNTER — Inpatient Hospital Stay (HOSPITAL_COMMUNITY): Payer: BC Managed Care – PPO

## 2019-06-10 LAB — URINALYSIS, ROUTINE W REFLEX MICROSCOPIC
Bilirubin Urine: NEGATIVE
Glucose, UA: NEGATIVE mg/dL
Hgb urine dipstick: NEGATIVE
Ketones, ur: 20 mg/dL — AB
Leukocytes,Ua: NEGATIVE
Nitrite: NEGATIVE
Protein, ur: NEGATIVE mg/dL
Specific Gravity, Urine: 1.012 (ref 1.005–1.030)
pH: 7 (ref 5.0–8.0)

## 2019-06-10 MED ORDER — ZOLPIDEM TARTRATE 5 MG PO TABS
5.0000 mg | ORAL_TABLET | Freq: Every evening | ORAL | Status: DC | PRN
  Administered 2019-06-10: 5 mg via ORAL
  Filled 2019-06-10: qty 1

## 2019-06-10 NOTE — Plan of Care (Signed)

## 2019-06-10 NOTE — Discharge Instructions (Signed)

## 2019-06-10 NOTE — Progress Notes (Addendum)
3 Days Post-Op Procedure(s) (LRB): VIDEO ASSISTED THORACOSCOPY (VATS)/WEDGE RESECTION (Left) Subjective: Awake and alert, sitting up in bed.  Says she feels good, no new complaints but had a fever this AM to 100.52f.   Objective: Vital signs in last 24 hours: Temp:  [97.7 F (36.5 C)-100.5 F (38.1 C)] 100.5 F (38.1 C) (12/10 0741) Pulse Rate:  [87-94] 89 (12/10 0342) Cardiac Rhythm: Sinus tachycardia (12/10 0741) Resp:  [16-24] 23 (12/10 0741) BP: (93-130)/(62-80) 120/76 (12/10 0741) SpO2:  [91 %-97 %] 95 % (12/10 0741)  Hemodynamic parameters for last 24 hours:    Intake/Output from previous day: 12/09 0701 - 12/10 0700 In: -  Out: 80 [Chest Tube:80] Intake/Output this shift: Total I/O In: 360 [P.O.:360] Out: -   General appearance: alert, cooperative and no distress Neurologic: intact Heart: SR Lungs: Breath sounds clear. CXR stable  Wound: Dry and intact.   Lab Results: Recent Labs    06/08/19 0400 06/09/19 0154  WBC 17.7* 11.6*  HGB 12.2 11.2*  HCT 36.8 33.0*  PLT 261 202   BMET:  Recent Labs    06/08/19 0400 06/09/19 0154  NA 137 137  K 4.0 4.2  CL 100 101  CO2 25 29  GLUCOSE 120* 124*  BUN 8 15  CREATININE 0.70 0.87  CALCIUM 8.8* 8.5*    PT/INR: No results for input(s): LABPROT, INR in the last 72 hours. ABG    Component Value Date/Time   PHART 7.429 06/03/2019 1334   HCO3 24.4 06/03/2019 1334   O2SAT 97.5 06/03/2019 1334   CBG (last 3)  Recent Labs    06/07/19 2354  GLUCAP 145*    Assessment/Plan: S/P Procedure(s) (LRB): VIDEO ASSISTED THORACOSCOPY (VATS)/WEDGE RESECTION (Left)  -POD3 VATS wedge resection of LUL nodule negative for malignancy. CT out yesterday, no PTX on CXR.  -Fever- CXR showing low lung volumes with patchy ATX --likely source of fever. Encourage pulmonary hygiene with IS and flutter valve today. Check UA, repeat CBC in AM.   -EtOH dependence- anxiety / agitation resolved. Continue PRN Librium and  Ativan  -DVT PPX- continue Lovenox.    LOS: 3 days    Antony Odea, PA-C [phone] 06/10/2019  Patient seen and examined, agree with above Low grade temp- likely atelectasis Continue IS, will add Flutter valve Check UA, central line out, IV site OK  Orrin Yurkovich C. Roxan Hockey, MD [facility] [phone]

## 2019-06-10 NOTE — Discharge Summary (Addendum)
Physician Discharge Summary  Patient ID: HEPHZIBAH STREHLE MRN: [id number] DOB/AGE: [birth year] 56 y.o.  Admit date: 06/07/2019 Discharge date: 06/11/2019  Admission Diagnoses: Left pulmonary nodule Tobacco abuse Alcohol abuse Hypertension Major depression disorder Anxiety Peripheral arterial disease History of deep vein thrombosis  Discharge Diagnoses:   Left pulmonary nodule, benign Tobacco abuse Alcohol abuse Hypertension Major depression disorder Anxiety Peripheral arterial disease History of deep vein thrombosis   Discharged Condition: Stable  History of Present Illness: Alix Lahmann is a 58 year old woman with a history of tobacco abuse, ethanol abuse, anxiety, depression, COPD, peripheral arterial embolization and a possible PFO (although not demonstrated on most recent echo in our system).  She recently saw Dr. Ardeth Perfect regarding a cough.  Given her smoking history a low-dose screening CT was done.  It demonstrated a 7 mm spiculated nodule in the left upper lobe adjacent to the major fissure with tenting of the fissure.  This was felt by radiology to be high risk lesion with a high likelihood of being a non-small cell carcinoma.  There was no hilar or mediastinal adenopathy.  On PET CT the nodule is too small to characterize but there was no evidence of regional or distant metastatic disease.  I reviewed the images with Mrs. Mennen and her husband.  We discussed the differential diagnosis which includes non-small cell carcinoma, granulomatous disease, as well as some other infectious or inflammatory nodules.  We did discuss the radiographic features which make this concerning including spiculation and tenting of the major fissure.  We discussed potential options for diagnosis and treatment.  We discussed radiographic follow-up, bronchoscopic biopsy, CT-guided biopsy, or VATS for wedge resection for definitive diagnosis and treatment with either  segmentectomy or lobectomy at the same setting.  We discussed the relative advantages and disadvantages of each of those approaches.  The lesion is not amenable to CT-guided biopsy.  Bronchoscopic biopsy would be a very low yield and relatively high risk procedure given the small size and proximity to the fissure.  That really leaves [place] with radiographic follow-up or proceeding for surgical resection.  She very strongly favors an aggressive approach.  Her mother died of lung cancer after initially having been found to have a small nodule.  She would like to have the nodule removed even if it turns out to be benign.  I discussed the proposed operation of left VATS for wedge resection followed by segmentectomy or possible lobectomy although I do not think that will be necessary.  We reviewed the general nature of the procedure including the need for general anesthesia, the incision to be used, the use of drains to postoperatively, the expected hospital stay, and the overall recovery.  We discussed the indications, risks, benefits, and alternatives.  She understands the risks include, but not limited to death, MI, DVT PE, bleeding, possible need for transfusion, infection, stroke, prolonged air leak, cardiac arrhythmias, as well as possibility of other unstable complications.  She understands accepts the risks and wishes to proceed with surgery.  Hospital Course: Ms. Foos presented to the hospital for samd-day surgery and was taken to the OR where left VATS was carried out for wedge resection / biopsy of the left upper lobe lesion. Frozen section on the lesion showed no malignancy.  Following the procedure, she was recovered in the PACU and later transferred to [facility] ICU. She remained stable from a respiratory standpoint.  On the second post-op day, she developed restlessness and severe agitation. The wa felt to be related to  alcohol withdrawal so she was treated with Ativan and Librium as needed. Her  agitation resolved and she returned to her baseline mental status. Her chest tube was removed on 12/10 and the follow CXR was satisfactory but did show some lower lobe volume loss and significant atelectasis. Pulmonary hygiene was encouraged. This was felt to be the asis for a low-grade fever she had on POD3 .  Repeat CXR was obtained on 06/11/19 and showed no pneumothorax, mild bilateral atelectasis and tiny bilateral pleural effusions. The final path result for the LUL wedge was fibrous scar with chronic inflammation. All lymph nodes sampled were negative for malignancy. She had a fever to 100.5 yesterday. UA showed no UTI. WBC slightly increased to 12,000. Her wounds are clean and dry. She has been ambulating on room air. Per Dr. Roxan Hockey, she will be given a prescription for Librium PRN (has ETOH dependence). As discussed with Dr. Roxan Hockey, she is felt surgically stable for discharge.   Consults: None  Significant Diagnostic Studies:   EXAM: CT CHEST WITHOUT CONTRAST LOW-DOSE FOR LUNG CANCER SCREENING  TECHNIQUE: Multidetector CT imaging of the chest was performed following the standard protocol without IV contrast.  COMPARISON:  None.  FINDINGS: Cardiovascular: Normal heart size. No significant pericardial effusion/thickening. Mildly atherosclerotic nonaneurysmal thoracicaorta. Normal caliber pulmon ary arteries.  Mediastinum/Nodes: No discrete thyroid nodules. Unremarkable esophagus. No pathologically enlarged axillary, mediastinal or hilar lymph nodes, noting limited sensitivity for the detection of hilar adenopathy on this noncontrast study.  Lungs/Pleura: No pneumothorax. No pleural effusion. Mild centrilobular emphysema. No acute consolidative airspace disease or lung masses. There are 2 scattered pulmonary nodules, the largest of which in the posterior left upper lobe measures 6.3 mm in volume derived mean diameter (series 9/image 116) and demonstrates aggressive  features including slightly spiculated margins and tenting of the adjacent left major fissure.  Upper abdomen: No acute abnormality.  Musculoskeletal: No aggressive appearing focal osseous lesions. Minimal thoracic spondylosis.  IMPRESSION: Lung-RADS 4X, highly suspicious. 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 Additional imaging evaluation or consultation with Pulmonology or Thoracic Surgery recommended. Initial follow up low-dose chest CT without contrast in 3 months (please use the following order, "CT CHEST LCS NODULE FOLLOW-UP W/O CM") is recommended at a minimum. This nodule is below PET resolution.  Aortic Atherosclerosis (ICD10-I70.0) and Emphysema (ICD10-J43.9).  These results will be called to the ordering clinician or representative by the Radiologist Assistant, and communication documented in the PACS or zVision Dashboard.   Electronically Signed   By: Ilona Sorrel M.D.   On: 05/07/2019 16:11    SURGICAL PATHOLOGY  CASE: [id number]  PATIENT: [facility]  Surgical Pathology Report   Clinical History: LUL lung nodule (cm)   FINAL MICROSCOPIC DIAGNOSIS:   A. LUNG, LEFT UPPER LOBE, WEDGE RESECTION:  - Fibrous scar with associated chronic inflammation.  - No malignancy identified.   B. LYMPH NODE, LEVEL 11, EXCISION:  - One of one lymph nodes negative for carcinoma (0/1).   C. LYMPH NODE, LEVEL 5, EXCISION:  - One of one lymph nodes negative for carcinoma (0/1).   D. LYMPH NODE, LEVEL 6, EXCISION:  - One of one lymph nodes negative for carcinoma (0/1).   E. LYMPH NODE, LEVEL 9, EXCISION:  - One of one lymph nodes negative for carcinoma (0/1).   INTRAOPERATIVE DIAGNOSIS:   A. LEFT UPPER LUNG LOBE NODULE: FIBROUS SCAR. DR.  BUTLER REVIEWED.  CALLED THE OR 17/DR. Bruna Dills AT 10:15 AM PER DR.CANACCI.     GROSS DESCRIPTION:   A. Received fresh for intraoperative consult is a 11 g, 6.8 x 3 x 2 cm wedge of lung parenchyma, with a glistening tan-pink to mottled pleural  surface with embedded black suture. Upon sectioning, there is a 0.5 x 0.5 x 0.4 cm ill-defined, firm lesion at the stapled margin at the suture, entirely frozen. Sections are submitted for routine histology  as follows:   Block summary:  1 = lesion entirely, frozen section remnant  2 = surrounding parenchyma, frozen section remnant   B. Received in saline is a 1 x 0.6 x 0.2 cm aggregate of anthracotic lymph node fragments, submitted in block B1.   C. Received in saline is a 1 x 0.8 x 0.3 cm aggregate of anthracotic lymph node fragments, submitted in block C1.   D. Received in saline is a 0.6 cm anthracotic lymph node fragment,  submitted in block D1.   E. Received in saline is a 0.7 cm fatty and anthracotic lymph node  fragment, submitted in block E1. (AK 06/07/2019)    Final Diagnosis performed by Vicente Males, MD.  Electronically signed  06/08/2019  Technical component performed at [facility]. [facility], [place] [address], [place], [place] [address].  Professional component performed at [facility],  [place] [address], [place], [place] [address].  Immunohistochemistry Technical component (if applicable) was performed  at [facility]. [address] [place],  [place], [place] [address].  IMMUNOHISTOCHEMISTRY DISCLAIMER (if applicable):  Some of these immunohistochemical stains may have been developed and the  performance characteristics determine by [facility]. Some  may not have been cleared or approved by the U.S. Food and Drug  Administration. The FDA has determined that such clearance or approval  is not necessary. This test is used for clinical purposes. It should not  be regarded as investigational or for research. This laboratory is   certified under the [place]  (CLIA-88) as qualified to perform high complexity clinical laboratory  testing. The controls stained appropriately.    Treatments:   OPERATIVE REPORT  DATE OF PROCEDURE:  06/07/2019  PREOPERATIVE DIAGNOSIS:  Left upper lobe lung nodule.  POSTOPERATIVE DIAGNOSES:   1.  Fibrotic nodule.   2.  No malignancy.  PROCEDURES:   1.  Left video-assisted thoracoscopy.  2.  Wedge resection of left upper lobe nodule.  3.  Intercostal nerve block levels 3 through 10.  SURGEON:  Modesto Charon, MD  ASSISTANT:  Enid Cutter, PA-C  ANESTHESIA:  General.  FINDINGS:  A nodule on the midportion of left upper lobe along the major fissure with visceral pleural invagination.  Frozen section revealed fibrotic nodule.   Discharge Exam: Blood pressure 110/74, pulse 93, temperature 97.9 F (36.6 C), temperature source Oral, resp. rate 15, height 5' (1.524 m), weight 69 kg, SpO2 97 %.   Cardiovascular: RRR Pulmonary: Clear to auscultation bilaterally Abdomen: Soft, non tender, bowel sounds present. Wounds: Clean and dry.  No erythema or signs of infection.   Disposition: Discharge disposition: 01-Home or Self Care        Allergies as of 06/11/2019      Reactions   Codeine Shortness Of Breath   Latex Other (See Comments)   Sensitive/red skin      Medication List    STOP taking these medications   influenza vac split quadrivalent  PF 0.5 ML injection Commonly known as: FLUARIX     TAKE these medications   aspirin EC 81 MG tablet Take 81 mg by mouth daily.   chlordiazePOXIDE 25 MG capsule Commonly known as: LIBRIUM Take 1 capsule (25 mg total) by mouth every 6 (six) hours as needed for withdrawal (CIWA score > 10).   diphenhydrAMINE 25 MG tablet Commonly known as: BENADRYL Take 25 mg by mouth at bedtime as needed.   escitalopram 20 MG tablet Commonly known as: LEXAPRO Take 20 mg  by mouth daily.   famotidine 20 MG tablet Commonly known as: PEPCID Take 20 mg by mouth at bedtime.   lisinopril-hydrochlorothiazide 10-12.5 MG tablet Commonly known as: Zestoretic For high blood pressure What changed: additional instructions   Melatonin 5 MG Tabs Take 5 mg by mouth once.   multivitamin with minerals Tabs tablet Take 1 tablet by mouth daily.   nicotine 21 mg/24hr patch Commonly known as: NICODERM CQ - dosed in mg/24 hours Place 21 mg onto the skin daily.   oxyCODONE 5 MG immediate release tablet Commonly known as: Oxy IR/ROXICODONE Take 1 tablet (5 mg total) by mouth every 4 (four) hours as needed for moderate pain.   rosuvastatin 5 MG tablet Commonly known as: CRESTOR Take 5 mg by mouth daily.      Follow-up Information    [name], MD. Go on 07/06/2019.   Specialty: Cardiothoracic Surgery Why: PA/LAT CXR to be taken (at [facility] which is in the same building as Dr. Leonarda Salon office) at 01/05 at 12:30 pm;Appointment time is at 1:00 pm Contact information: [place] [address] [phone]           Signed: Nani Skillern, PA-C 06/11/2019, 10:41 AM

## 2019-06-11 ENCOUNTER — Inpatient Hospital Stay (HOSPITAL_COMMUNITY): Payer: BC Managed Care – PPO

## 2019-06-11 LAB — CBC
HCT: 32 % — ABNORMAL LOW (ref 36.0–46.0)
Hemoglobin: 10.9 g/dL — ABNORMAL LOW (ref 12.0–15.0)
MCH: 32.3 pg (ref 26.0–34.0)
MCHC: 34.1 g/dL (ref 30.0–36.0)
MCV: 95 fL (ref 80.0–100.0)
Platelets: 198 10*3/uL (ref 150–400)
RBC: 3.37 MIL/uL — ABNORMAL LOW (ref 3.87–5.11)
RDW: 12.6 % (ref 11.5–15.5)
WBC: 12 10*3/uL — ABNORMAL HIGH (ref 4.0–10.5)
nRBC: 0 % (ref 0.0–0.2)

## 2019-06-11 MED ORDER — LISINOPRIL-HYDROCHLOROTHIAZIDE 10-12.5 MG PO TABS: ORAL_TABLET | ORAL | 11 refills | Status: DC

## 2019-06-11 MED ORDER — CHLORDIAZEPOXIDE HCL 25 MG PO CAPS: 25.0000 mg | ORAL_CAPSULE | Freq: Four times a day (QID) | ORAL | 0 refills | Status: DC | PRN

## 2019-06-11 MED ORDER — OXYCODONE HCL 5 MG PO TABS: 5.0000 mg | ORAL_TABLET | ORAL | 0 refills | Status: DC | PRN

## 2019-06-11 NOTE — Plan of Care (Signed)

## 2019-06-11 NOTE — Progress Notes (Signed)
      [facility] [address]       [place] [address]             [phone]       4 Days Post-Op Procedure(s) (LRB): VIDEO ASSISTED THORACOSCOPY (VATS)/WEDGE RESECTION (Left)  Subjective: Patient sleeping but awakened. She wants to go home.  Objective: Vital signs in last 24 hours: Temp:  [97.9 F (36.6 C)-99.8 F (37.7 C)] 97.9 F (36.6 C) (12/11 0822) Pulse Rate:  [73-102] 93 (12/11 0822) Cardiac Rhythm: Normal sinus rhythm (12/11 0800) Resp:  [15-29] 15 (12/11 0822) BP: (91-126)/(57-74) 110/74 (12/11 0822) SpO2:  [94 %-97 %] 97 % (12/11 0822)     Intake/Output from previous day: 12/10 0701 - 12/11 0700 In: 1200 [P.O.:1200] Out: -    Physical Exam:  Cardiovascular: RRR Pulmonary: Clear to auscultation bilaterally Abdomen: Soft, non tender, bowel sounds present. Wounds: Clean and dry.  No erythema or signs of infection.   Lab Results: CBC: Recent Labs    06/09/19 0154 06/11/19 0248  WBC 11.6* 12.0*  HGB 11.2* 10.9*  HCT 33.0* 32.0*  PLT 202 198   BMET:  Recent Labs    06/09/19 0154  NA 137  K 4.2  CL 101  CO2 29  GLUCOSE 124*  BUN 15  CREATININE 0.87  CALCIUM 8.5*    PT/INR: No results for input(s): LABPROT, INR in the last 72 hours. ABG:  INR: Will add last result for INR, ABG once components are confirmed Will add last 4 CBG results once components are confirmed  Assessment/Plan:  1. CV - SR. 2.  Pulmonary - On room air. CXR this am shows no pneumothorax, mild bilateral atelectasis and tiny pleural effusions. Encourage incentive spirometer. 3. ABL anemia-H and H this am 10.9 and 32. 4. ETOH dependence-Per Dr. Roxan Hockey will give a week of Librium PRN 5. As discussed with Dr. Roxan Hockey, will discharge  [facility] ZimmermanPA-C 06/11/2019,10:21 AM [phone]

## 2019-06-11 NOTE — TOC Progression Note (Addendum)
Transition of Care [facility]) - Progression Note    Patient Details  Name: Patricia [name] MRN: [id number] Date of Birth: October 16, 1960  Transition of [facility]) CM/SW Contact  Zenon Mayo, RN Phone Number: 06/11/2019, 9:32 AM  Clinical Narrative:    POD 4 VATS, chest tube has been dc'd, checking UA and CBX today, patient had fever.  TOC team will continue to follow for dc needs.        Expected Discharge Plan and Services                                                 Social Determinants of Health (SDOH) Interventions    Readmission Risk Interventions No flowsheet data found.

## 2019-06-16 ENCOUNTER — Encounter: Payer: Self-pay | Admitting: *Deleted

## 2019-06-16 NOTE — Progress Notes (Signed)
Called patient to check on her recovery after her VATS procedure. Pathology showed no malignancy.   She is doing well. Very relieved to have a negative biopsy. She has quit smoking and drinking and states she is doing well. She's recovering as expected with no complications and has her follow up appointment scheduled.   Since she doesn't have a malignancy, she doesn't need to follow up with Dr Maylon Peppers. Patient knows that if she has any problems or concerns in the future, she is more than welcome to reach out.

## 2019-06-22 ENCOUNTER — Ambulatory Visit: Payer: BC Managed Care – PPO | Admitting: Gastroenterology

## 2019-06-30 ENCOUNTER — Other Ambulatory Visit: Payer: Self-pay | Admitting: Thoracic Surgery (Cardiothoracic Vascular Surgery)

## 2019-06-30 DIAGNOSIS — C3412 Malignant neoplasm of upper lobe, left bronchus or lung: Secondary | ICD-10-CM

## 2019-07-06 ENCOUNTER — Ambulatory Visit
Admission: RE | Admit: 2019-07-06 | Discharge: 2019-07-06 | Disposition: A | Discharge status: Home / Self Care | Payer: BC Managed Care – PPO | Source: Ambulatory Visit | Attending: Thoracic Surgery (Cardiothoracic Vascular Surgery) | Admitting: Thoracic Surgery (Cardiothoracic Vascular Surgery)

## 2019-07-06 ENCOUNTER — Ambulatory Visit (INDEPENDENT_AMBULATORY_CARE_PROVIDER_SITE_OTHER): Payer: Self-pay | Admitting: Thoracic Surgery (Cardiothoracic Vascular Surgery)

## 2019-07-06 ENCOUNTER — Other Ambulatory Visit: Payer: Self-pay

## 2019-07-06 VITALS — BP 127/70 | HR 73 | Temp 97.7°F | Resp 20 | Ht 60.0 in | Wt 138.0 lb

## 2019-07-06 DIAGNOSIS — C3412 Malignant neoplasm of upper lobe, left bronchus or lung: Secondary | ICD-10-CM

## 2019-07-06 DIAGNOSIS — R911 Solitary pulmonary nodule: Secondary | ICD-10-CM

## 2019-07-06 DIAGNOSIS — C3490 Malignant neoplasm of unspecified part of unspecified bronchus or lung: Secondary | ICD-10-CM | POA: Diagnosis not present

## 2019-07-06 NOTE — Progress Notes (Signed)
[address]       [place] [address]             [phone]     HPI: [name] returns for a scheduled postoperative follow-up visit  Patricia [name] is a 59 year old woman with a history of tobacco abuse, ethanol dependence, major depressive disorder, peripheral arterial disease, possible PFO, and anxiety.  She has a history of smoking for the past 41 years.  She had a low-dose screening CT which showed a 7 mm nodule in the left upper lobe along the major fissure.  PET/CT showed no evidence of regional or distant metastatic disease.  The nodule was too small to characterize.  We discussed surgical resection versus radiographic follow-up.  She was in favor of a an aggressive approach with resection for definitive diagnosis and treatment.  I did a left VATS for wedge resection on 06/07/2019.  The nodule turned out to be fibrosis.  No malignancy was seen.  She had a lot of anxiety issues postoperatively, but no significant complications.  She went home on day 4.  Since discharge she has been doing well.  She has weaned herself off of narcotics.  She is not drinking.  She is using a NicoDerm patch and is smoking 2 to 3 cigarettes a day.  Past Medical History:  Diagnosis Date  . Anemia   . Anxiety   . Complication of anesthesia    woken up before over  . Cough   . Hypertension   . Nodule of lower lobe of left lung   . Peripheral vascular disease ([place])    acute right popliteal artery thrombosis, s/p mechanical thrombectomy and thrombolysis 06/16/07 in [place]; scheduled for surgical right popliteal artery thrombectomy 07/22/07  . PFO with atrial septal aneurysm    07/17/07 vascular surgery notes by Dr. Sandrea Matte, TEE done as part of acute right popliteal artery occlusion work-up showed no evidence of thrombus; howevever "there is evidence of a small patent foramen ovale with associated small aneuysmal finding"    Current Outpatient Medications  Medication  Sig Dispense Refill  . aspirin EC 81 MG tablet Take 81 mg by mouth daily.    . diphenhydrAMINE (BENADRYL) 25 MG tablet Take 25 mg by mouth at bedtime as needed.    Marland Kitchen escitalopram (LEXAPRO) 20 MG tablet Take 20 mg by mouth daily.    . famotidine (PEPCID) 20 MG tablet Take 20 mg by mouth at bedtime.    Marland Kitchen lisinopril-hydrochlorothiazide (ZESTORETIC) 10-12.5 MG tablet For high blood pressure 30 tablet 11  . Melatonin 5 MG TABS Take 5 mg by mouth once.    . Multiple Vitamin (MULTIVITAMIN WITH MINERALS) TABS tablet Take 1 tablet by mouth daily.    . nicotine (NICODERM CQ - DOSED IN MG/24 HOURS) 21 mg/24hr patch Place 21 mg onto the skin daily.    . rosuvastatin (CRESTOR) 5 MG tablet Take 5 mg by mouth daily.     No current facility-administered medications for this visit.    Physical Exam BP 127/70 (BP Location: Right Arm)   Pulse 73   Temp 97.7 F (36.5 C) (Skin)   Resp 20   Ht 5' (1.524 m)   Wt 138 lb (62.6 kg)   SpO2 94% Comment: RA  BMI 26.2 kg/m  59 year old woman in no acute distress Alert and oriented x3 with no focal deficits Lungs clear with equal breath sounds bilaterally Incisions well-healed Cardiac regular rate and rhythm  No peripheral edema  Diagnostic Tests: CHEST - 2 VIEW  COMPARISON:  06/11/2019.  CT 05/07/2019.  FINDINGS: Mediastinum and hilar structures normal. Postsurgical changes left lung. Improved aeration in the lung bases with mild residual bibasilar subsegmental atelectasis. No focal alveolar infiltrate. Mild left major fissure pleural thickening. No prominent pleural effusion or pneumothorax. Heart size normal. No acute bony abnormality.  IMPRESSION: Postsurgical changes left lung. Improved aeration in the lung bases with mild residual bibasilar subsegmental atelectasis. No focal infiltrate. Mild left major fissure pleural thickening. No prominent pleural effusion or pneumothorax.   Electronically Signed   By: Marcello Moores  Register   On:  07/06/2019 12:40 I personally reviewed the chest x-ray images and concur with the findings noted above  Pathology A. LUNG, LEFT UPPER LOBE, WEDGE RESECTION:  - Fibrous scar with associated chronic inflammation.  - No malignancy identified.   Impression: Patricia [name] is a 59 year old woman with a history of tobacco abuse who had a suspicious nodule found on a low-dose screening CT.  This nodule was only 7 mm but had multiple radiographic findings suggesting malignancy.  It was negative on PET, but was too small to characterize.  She opted for surgical resection over radiographic follow-up.  Fortunately the nodule turned out not to be cancerous.  It was a fibrotic nodule.  From a surgical standpoint she is doing well.  There are no restrictions on her activities at this point.  She was advised to build into new activities gradually.  Tobacco abuse-emphasized the importance of complete cessation.  She does seem to be motivated so hopefully she will be able to quit.  Ethanol abuse-she has stopped drinking.  I congratulated her for that.  Plan: She will need to continue to follow with the low-dose screening CT scans with Dr. Ardeth Perfect.  I will be happy to see her back anytime in the future if I can be of any further assistance with her care  [name], MD [facility] [phone]

## 2019-07-14 DIAGNOSIS — N941 Unspecified dyspareunia: Secondary | ICD-10-CM | POA: Diagnosis not present

## 2019-07-14 DIAGNOSIS — N952 Postmenopausal atrophic vaginitis: Secondary | ICD-10-CM | POA: Diagnosis not present

## 2019-12-29 DIAGNOSIS — Z1231 Encounter for screening mammogram for malignant neoplasm of breast: Secondary | ICD-10-CM | POA: Diagnosis not present

## 2019-12-29 DIAGNOSIS — Z6827 Body mass index (BMI) 27.0-27.9, adult: Secondary | ICD-10-CM | POA: Diagnosis not present

## 2019-12-29 DIAGNOSIS — Z01419 Encounter for gynecological examination (general) (routine) without abnormal findings: Secondary | ICD-10-CM | POA: Diagnosis not present

## 2020-05-02 DIAGNOSIS — E785 Hyperlipidemia, unspecified: Secondary | ICD-10-CM | POA: Diagnosis not present

## 2020-05-02 DIAGNOSIS — Z0000 Encounter for general adult medical examination without abnormal findings: Secondary | ICD-10-CM | POA: Diagnosis not present

## 2020-05-09 DIAGNOSIS — I10 Essential (primary) hypertension: Secondary | ICD-10-CM | POA: Diagnosis not present

## 2020-05-09 DIAGNOSIS — E785 Hyperlipidemia, unspecified: Secondary | ICD-10-CM | POA: Diagnosis not present

## 2020-05-09 DIAGNOSIS — Z0000 Encounter for general adult medical examination without abnormal findings: Secondary | ICD-10-CM | POA: Diagnosis not present

## 2020-05-09 DIAGNOSIS — R82998 Other abnormal findings in urine: Secondary | ICD-10-CM | POA: Diagnosis not present

## 2020-05-09 DIAGNOSIS — F17200 Nicotine dependence, unspecified, uncomplicated: Secondary | ICD-10-CM | POA: Diagnosis not present

## 2020-05-11 ENCOUNTER — Other Ambulatory Visit: Payer: Self-pay | Admitting: Internal Medicine

## 2020-05-11 DIAGNOSIS — F17200 Nicotine dependence, unspecified, uncomplicated: Secondary | ICD-10-CM

## 2020-05-18 DIAGNOSIS — H02831 Dermatochalasis of right upper eyelid: Secondary | ICD-10-CM | POA: Diagnosis not present

## 2020-05-18 DIAGNOSIS — H02835 Dermatochalasis of left lower eyelid: Secondary | ICD-10-CM | POA: Diagnosis not present

## 2020-05-18 DIAGNOSIS — F17210 Nicotine dependence, cigarettes, uncomplicated: Secondary | ICD-10-CM | POA: Diagnosis not present

## 2020-05-18 DIAGNOSIS — H02832 Dermatochalasis of right lower eyelid: Secondary | ICD-10-CM | POA: Diagnosis not present

## 2020-05-18 DIAGNOSIS — H02834 Dermatochalasis of left upper eyelid: Secondary | ICD-10-CM | POA: Diagnosis not present

## 2020-05-30 ENCOUNTER — Ambulatory Visit: Payer: BC Managed Care – PPO | Admitting: Gastroenterology

## 2020-05-30 ENCOUNTER — Encounter: Payer: Self-pay | Admitting: Gastroenterology

## 2020-05-30 VITALS — BP 134/80 | HR 72 | Ht 59.75 in | Wt 149.2 lb

## 2020-05-30 DIAGNOSIS — Z800 Family history of malignant neoplasm of digestive organs: Secondary | ICD-10-CM

## 2020-05-30 DIAGNOSIS — K219 Gastro-esophageal reflux disease without esophagitis: Secondary | ICD-10-CM

## 2020-05-30 DIAGNOSIS — Z86010 Personal history of colonic polyps: Secondary | ICD-10-CM

## 2020-05-30 MED ORDER — SUPREP BOWEL PREP KIT 17.5-3.13-1.6 GM/177ML PO SOLN: 1.0000 | ORAL | 0 refills | Status: DC

## 2020-05-30 NOTE — Progress Notes (Signed)
HPI: This is a pleasant 59 year old woman who was referred to me by [name], MD  to evaluate rectal bleeding, history of polyps, GERD, dysphagia, family history of esophageal cancer.    I have never seen her in the office before.  However I did a colonoscopy for her October 2014.  I found 2 subcentimeter polyps which were both hyperplastic.  I also noted some external hemorrhoids.  She had told us that prior to then she had had numerous polyps removed from her colon while living in [place].  Specifically she tells me that she had "19 polyps and 3 lesions" in her colon during her colonoscopy in [place] 8 or 9 years ago.  The doctor removed all 19 polyps and then brought her back 1 year later to address the "lesions".  Since then she was having a colonoscopy every year or 2.  None of those records are available at the time of this visit or at the time of her last colonoscopy which was a direct examination.  She was not exactly sure who the doctor was then but she did tell us she has tried to receive records from them and has had difficulty in the past.  She thinks his office is.  She had a VATS for wedge resection left upper lobe of her lung December 2020.  This was done for 7 mm PET negative nodule.  Pathology showed that it was fibrosis only.  She did not have cancer.  Currently she is having minor intermittent bright red blood per rectum and periodic anal spasms.  She knows she has hemorrhoids.  She is also having heartburn and intermittent dysphagia.  Apparently one of her grandparents died of esophageal cancers.  Her voice has been deep for a long time.  She smoked for many years and continues to smoke although only "3 cigarettes/day"  Her weight is up 15 pounds in the past 6 months  Review of systems: Pertinent positive and negative review of systems were noted in the above HPI section. All other review negative.   Past Medical History:  Diagnosis Date  . Anemia   . Anxiety   .  Complication of anesthesia    woken up before over  . Cough   . Hypertension   . Nodule of lower lobe of left lung   . Peripheral vascular disease ([place])    acute right popliteal artery thrombosis, s/p mechanical thrombectomy and thrombolysis 06/16/07 in [place]; scheduled for surgical right popliteal artery thrombectomy 07/22/07  . PFO with atrial septal aneurysm    07/17/07 vascular surgery notes by Dr. Sandrea Matte, TEE done as part of acute right popliteal artery occlusion work-up showed no evidence of thrombus; howevever "there is evidence of a small patent foramen ovale with associated small aneuysmal finding"    Past Surgical History:  Procedure Laterality Date  . BREAST SURGERY     reduction  . CESAREAN SECTION    . COLONOSCOPY  2006   Dr. Nyra Capes in [place]  . COSMETIC SURGERY    . tummy tuck    . VIDEO ASSISTED THORACOSCOPY (VATS)/WEDGE RESECTION Left 06/07/2019   Procedure: VIDEO ASSISTED THORACOSCOPY (VATS)/WEDGE RESECTION;  Surgeon: [name], MD;  Location: [facility] OR;  Service: Thoracic;  Laterality: Left;    Current Outpatient Medications  Medication Sig Dispense Refill  . escitalopram (LEXAPRO) 20 MG tablet Take 20 mg by mouth daily.    . famotidine (PEPCID) 20 MG tablet Take 20 mg by mouth at  bedtime.    Marland Kitchen losartan-hydrochlorothiazide (HYZAAR) 50-12.5 MG tablet Take 1 tablet by mouth daily.    . Melatonin 5 MG TABS Take 5 mg by mouth once.    . Multiple Vitamin (MULTIVITAMIN WITH MINERALS) TABS tablet Take 1 tablet by mouth daily.    . rosuvastatin (CRESTOR) 5 MG tablet Take 5 mg by mouth daily.     No current facility-administered medications for this visit.    Allergies as of 05/30/2020 - Review Complete 05/30/2020  Allergen Reaction Noted  . Codeine Shortness Of Breath 02/10/2013  . Latex Other (See Comments) 05/11/2019    Family History  Problem Relation Age of Onset  . Heart disease Mother   . Lung cancer Mother   . Barrett's esophagus Mother   .  Esophageal cancer Maternal Grandfather        and M greatgradmother  . Colon cancer Neg Hx   . Rectal cancer Neg Hx   . Stomach cancer Neg Hx     Social History   Socioeconomic History  . Marital status: Married    Spouse name: Not on file  . Number of children: Not on file  . Years of education: Not on file  . Highest education level: Not on file  Occupational History  . Not on file  Tobacco Use  . Smoking status: Current Every Day Smoker    Packs/day: 0.50    Years: 41.00    Pack years: 20.50    Types: Cigarettes  . Smokeless tobacco: Never Used  Vaping Use  . Vaping Use: Never used  Substance and Sexual Activity  . Alcohol use: Yes    Alcohol/week: 21.0 standard drinks    Types: 7 Glasses of wine, 14 Standard drinks or equivalent per week    Comment: Glass of wine nightly  . Drug use: Yes    Types: Marijuana, Cocaine, Methamphetamines    Comment: stopped marji.   stopped all  . Sexual activity: Yes  Other Topics Concern  . Not on file  Social History Narrative  . Not on file   Social Determinants of Health   Financial Resource Strain:   . Difficulty of Paying Living Expenses: Not on file  Food Insecurity:   . Worried About Charity fundraiser in the Last Year: Not on file  . Ran Out of Food in the Last Year: Not on file  Transportation Needs:   . Lack of Transportation (Medical): Not on file  . Lack of Transportation (Non-Medical): Not on file  Physical Activity:   . Days of Exercise per Week: Not on file  . Minutes of Exercise per Session: Not on file  Stress:   . Feeling of Stress : Not on file  Social Connections:   . Frequency of Communication with Friends and Family: Not on file  . Frequency of Social Gatherings with Friends and Family: Not on file  . Attends Religious Services: Not on file  . Active Member of Clubs or Organizations: Not on file  . Attends Archivist Meetings: Not on file  . Marital Status: Not on file  Intimate Partner  Violence:   . Fear of Current or Ex-Partner: Not on file  . Emotionally Abused: Not on file  . Physically Abused: Not on file  . Sexually Abused: Not on file     Physical Exam: BP 134/80 (BP Location: Left Arm, Patient Position: Sitting, Cuff Size: Normal)   Pulse 72   Ht 4' 11.75" (1.518 m) Comment: height  measured without shoes  Wt 149 lb 4 oz (67.7 kg)   BMI 29.39 kg/m  Constitutional: generally well-appearing Psychiatric: alert and oriented x3 Eyes: extraocular movements intact Mouth: oral pharynx moist, no lesions Neck: supple no lymphadenopathy Cardiovascular: heart regular rate and rhythm Lungs: clear to auscultation bilaterally Abdomen: soft, nontender, nondistended, no obvious ascites, no peritoneal signs, normal bowel sounds Extremities: no lower extremity edema bilaterally Skin: no lesions on visible extremities   Assessment and plan: 59 y.o. female with history of colon polyps, GERD, dysphagia, rectal bleeding, family history of esophageal cancer  I would really like to get a copy of her [place] colonoscopy reports and associated pathology.  She tells me she has had 19 polyps and 3 lesions removed from her colon.  It would be very helpful if we could actually see those reports and the associated pathology reports.  I am dubious that we will be able to unfortunately.  She is having rectal bleeding again.  This is likely hemorrhoidal clinically however I recommended a colonoscopy to exclude significant neoplastic lesions.  At the same time I recommended upper endoscopy given her family history of esophageal cancer, a grandparent, chronic heartburn and intermittent dysphagia.  She has been gaining weight, 15 pounds in the past 6 months and so I think it is unlikely that she has significant neoplastic process currently.   Please see the "Patient Instructions" section for addition details about the plan.   Owens Loffler, MD [facility] 05/30/2020, 3:41  PM  Cc: [name], MD  Total time on date of encounter was 45  minutes (this included time spent preparing to see the patient reviewing records; obtaining and/or reviewing separately obtained history; performing a medically appropriate exam and/or evaluation; counseling and educating the patient and family if present; ordering medications, tests or procedures if applicable; and documenting clinical information in the health record).

## 2020-05-30 NOTE — Patient Instructions (Signed)
If you are age 57 or older, your body mass index should be between 23-30. Your Body mass index is 29.39 kg/m. If this is out of the aforementioned range listed, please consider follow up with your Primary Care Provider.  If you are age 7 or younger, your body mass index should be between 19-25. Your Body mass index is 29.39 kg/m. If this is out of the aformentioned range listed, please consider follow up with your Primary Care Provider.    You have been scheduled for an endoscopy and colonoscopy. Please follow the written instructions given to you at your visit today. Please pick up your prep supplies at the pharmacy within the next 1-3 days. If you use inhalers (even only as needed), please bring them with you on the day of your procedure.  We have sent the following medications to your pharmacy for you to pick up at your convenience: Suprep   We will try to obtain records from [facility] in [place].   Thank you for choosing me and [facility].  Dr.Daniel Ardis Hughs

## 2020-06-05 ENCOUNTER — Ambulatory Visit
Admission: RE | Admit: 2020-06-05 | Discharge: 2020-06-05 | Disposition: A | Discharge status: Home / Self Care | Payer: BC Managed Care – PPO | Source: Ambulatory Visit | Attending: Internal Medicine | Admitting: Internal Medicine

## 2020-06-05 DIAGNOSIS — F17200 Nicotine dependence, unspecified, uncomplicated: Secondary | ICD-10-CM

## 2020-06-05 DIAGNOSIS — J439 Emphysema, unspecified: Secondary | ICD-10-CM | POA: Diagnosis not present

## 2020-06-05 DIAGNOSIS — E041 Nontoxic single thyroid nodule: Secondary | ICD-10-CM | POA: Diagnosis not present

## 2020-06-05 DIAGNOSIS — I700 Atherosclerosis of aorta: Secondary | ICD-10-CM | POA: Diagnosis not present

## 2020-06-05 DIAGNOSIS — F17210 Nicotine dependence, cigarettes, uncomplicated: Secondary | ICD-10-CM | POA: Diagnosis not present

## 2020-06-16 ENCOUNTER — Telehealth: Payer: Self-pay | Admitting: Gastroenterology

## 2020-06-16 NOTE — Telephone Encounter (Signed)
The pt has been advised and will keep appts as planned.

## 2020-06-16 NOTE — Telephone Encounter (Signed)
I reviewed a large packet of information from [place] [facility].  Colonoscopy report June 2007. Indication "history of polyps". Finding 4 subcentimeter polyps were removed from the rectum. These were removed with biopsy forceps only. Pathology showed these were hyperplastic polyps  EGD June 2007 indication "dyspepsia" findings normal examination except "mucosa suggestive of short segment Barrett's esophagus" that was biopsied, the stomach and the duodenum were were also biopsied. All of the biopsies were completely normal. No H. pylori, no sprue, no Barrett's esophagus.  Colonoscopy report June 2004, indication follow-up of polyps" findings 2 polyps were removed. One was 8 mm the other was 20 mm. These were both hyperplastic polyps.  Colonoscopy report May 2003, indication hematochezia. Findings 2 subcentimeter polyps were removed from the sigmoid. A "large sessile polyp with broad base was found in the descending colon. The polyp was at least 20 mm in size. Given the large size and sessile nature of the lesion was not removed completely. Biopsies were taken with cold forceps for Heist allergies. Another 15 mm polyp was removed from the transverse colon. All of these were hyperplastic polyps   Can you please let her know that I reviewed her [place] colonoscopy and upper endoscopy reports over the past many years. She has never had precancerous polyps removed. She should continue with her current plans for upcoming colonoscopy and upper endoscopy.

## 2020-07-03 ENCOUNTER — Telehealth: Payer: Self-pay | Admitting: Gastroenterology

## 2020-07-03 NOTE — Telephone Encounter (Signed)
Good morning Dr. Ardis Hughs, this patient called to reschedule her procedure from 07/07/20 to 08/28/20.  Patient states all her family has the flu and prefers to reschedule procedure.

## 2020-07-07 ENCOUNTER — Encounter: Payer: BC Managed Care – PPO | Admitting: Gastroenterology

## 2020-08-07 ENCOUNTER — Ambulatory Visit: Payer: BC Managed Care – PPO | Admitting: Podiatry

## 2020-08-07 ENCOUNTER — Encounter: Payer: Self-pay | Admitting: Podiatry

## 2020-08-07 ENCOUNTER — Other Ambulatory Visit: Payer: Self-pay

## 2020-08-07 DIAGNOSIS — M722 Plantar fascial fibromatosis: Secondary | ICD-10-CM | POA: Diagnosis not present

## 2020-08-08 NOTE — Progress Notes (Signed)
Subjective:   Patient ID: Patricia [name], female   DOB: 60 y.o.   MRN: [id number]   HPI Patient presents stating she developed a nodule in the bottom of her right foot recently and she is concerned about it and does have circulation issues and still smokes and knows she does not.  Smokes half pack per day does not drink and is just noticed this over the last month   Review of Systems  All other systems reviewed and are negative.       Objective:  Physical Exam Vitals and nursing note reviewed.  Constitutional:      Appearance: She is well-developed and well-nourished.  Cardiovascular:     Pulses: Intact distal pulses.  Pulmonary:     Effort: Pulmonary effort is normal.  Musculoskeletal:        General: Normal range of motion.  Skin:    General: Skin is warm.  Neurological:     Mental Status: She is alert.     Neurovascular status was found to be intact muscle strength was found to be adequate range of motion was found to be adequate.  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  Today I went ahead advised and explained plantar fibroma do not recommend further treatment but if it were to become pathological grow in size become painful it could be removed but we have a problem because of her diminished circulatory status which we would have to deal with.  She does see a cardiologist and continues to follow her circulatory issues  X-rays indicate no signs of calcification associated with this

## 2020-08-09 IMAGING — CT NM PET TUM IMG INITIAL (PI) SKULL BASE T - THIGH
8 series · 25 of 25 positions shown · non-contrast
Comparison: 20

CLINICAL DATA: Initial treatment strategy for solitary pulmonary
nodule. Strong smoking history

EXAM:
NUCLEAR MEDICINE PET SKULL BASE TO THIGH
TECHNIQUE: 7.2 mCi F-18 FDG was injected intravenously. Full-ring PET imaging
was performed from the skull base to thigh after the radiotracer. CT
data was obtained and used for attenuation correction and anatomic
localization.
Fasting blood glucose: 120 mg/dl

[Series 3: pet sk_thigh ac · axial · 5.0mm · 4.07mm/px · z∈[-764,+64]mm · 5 of 208 slices shown]
[im 1/208]
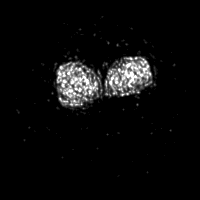
[im 52/208]
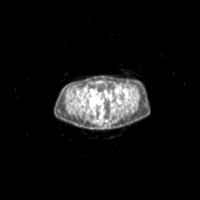
[im 104/208]
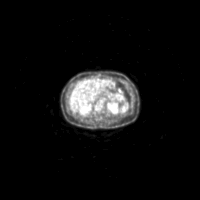
[im 156/208]
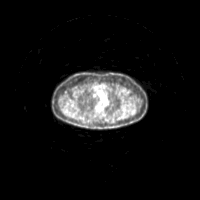
[im 208/208]
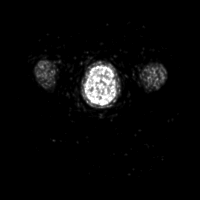

[Series 4: ct sk_thigh 5.0 b31f · axial · 5.0mm · 0.98mm/px · z∈[-764,+64]mm · 5 of 208 slices shown]
[im 1/208]
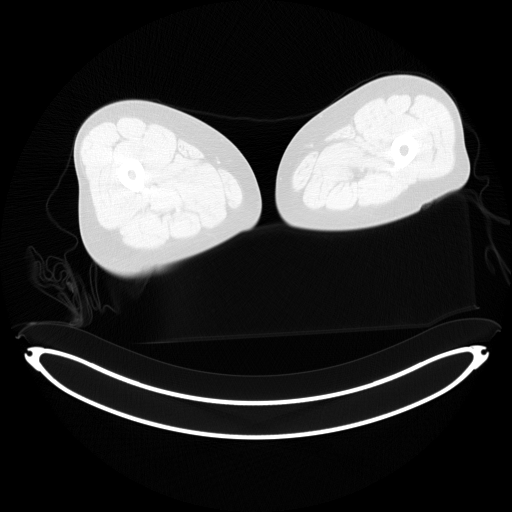
[im 52/208]
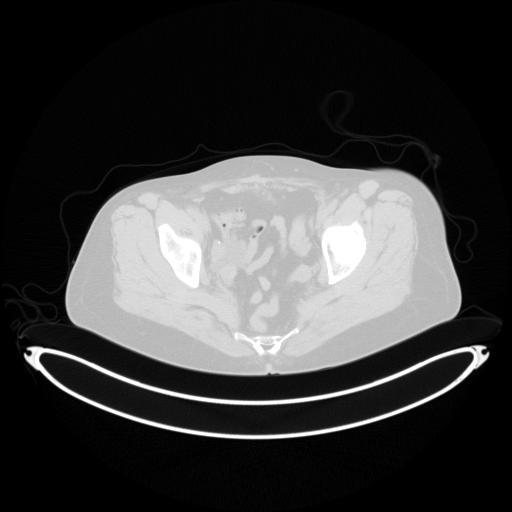
[im 104/208]
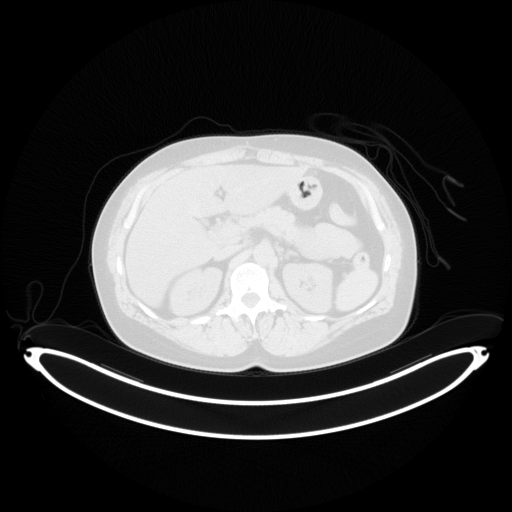
[im 156/208]
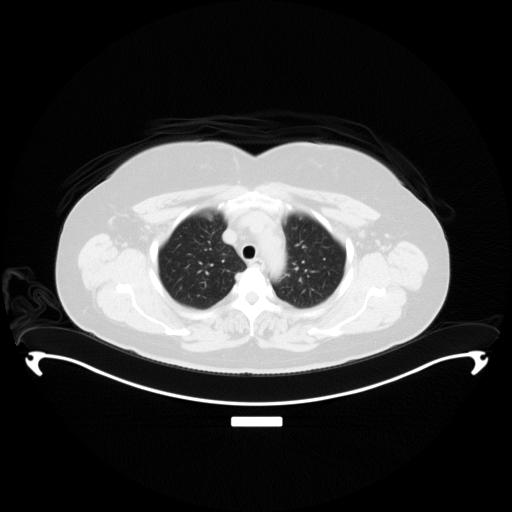
[im 208/208  brain]
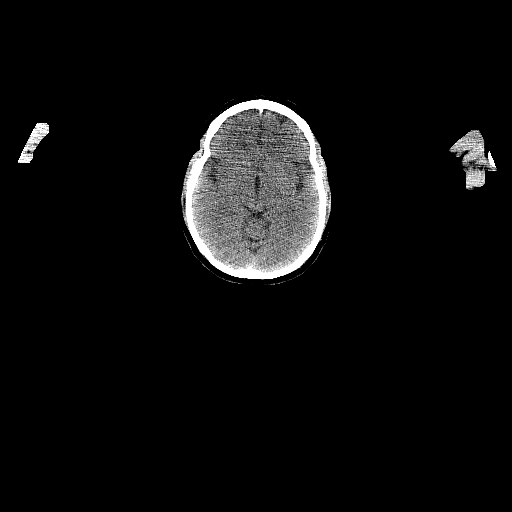

[Series 5: pet sk_thigh nac · axial · 5.0mm · 4.07mm/px · z∈[-764,+64]mm · 5 of 208 slices shown]
[im 1/208]
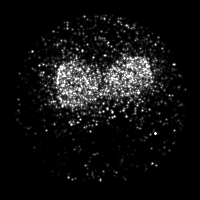
[im 52/208]
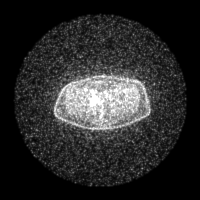
[im 104/208]
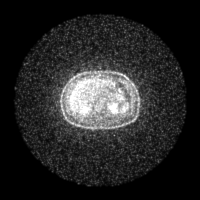
[im 156/208]
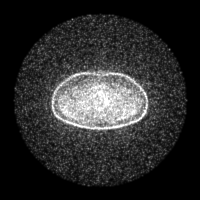
[im 208/208]
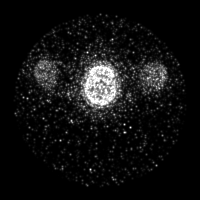

[Series 8: ct sk_thigh 5.0 b70f lung_bone · axial · 5.0mm · 0.85mm/px · 1 of 57 slices shown]
[im 1/57  bone]
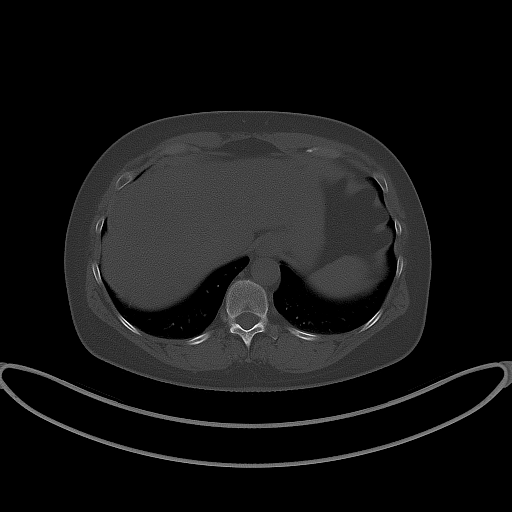

[Series 603: mip range 2 · coronal · 1.72mm/px · 1 of 32 slices shown]
[im 1/32]
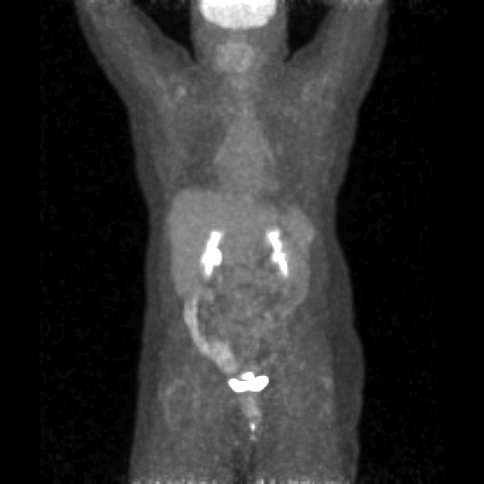

[Series 604: range-ct sk_thigh 5.0 (id)<alpha range> · 2 of 81 slices shown (1 of 2)]
[im 1/81]
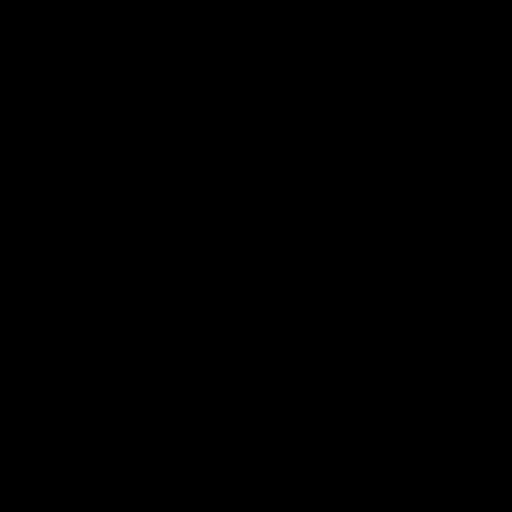
[im 81/81]
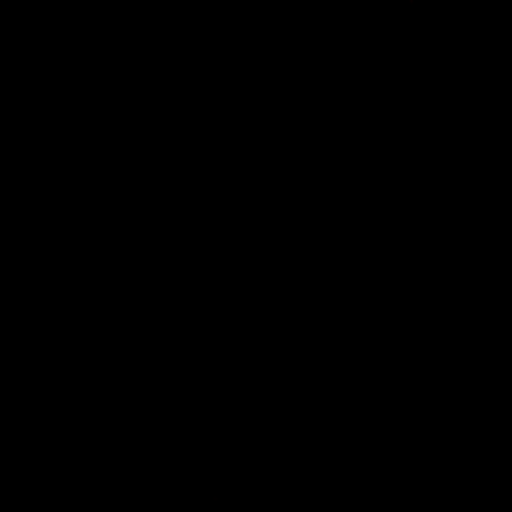

[Series 605: range-ct sk_thigh 5.0 (id)<alpha range> · 5 of 203 slices shown (2 of 2)]
[im 1/203]
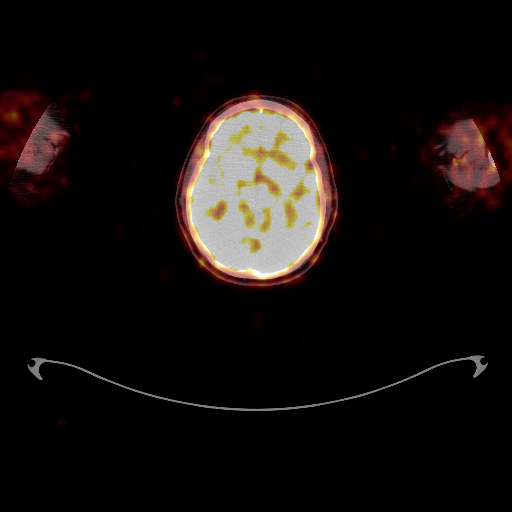
[im 51/203]
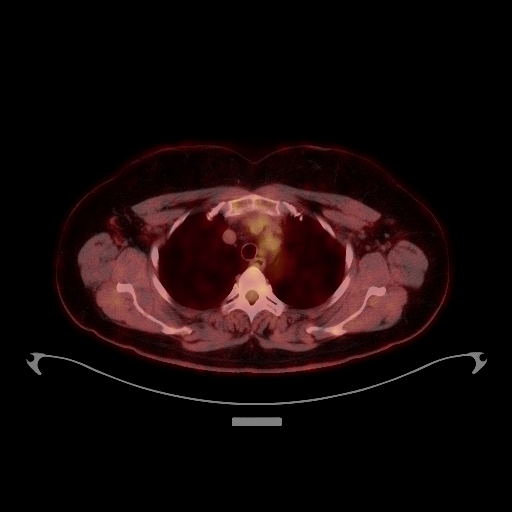
[im 102/203]
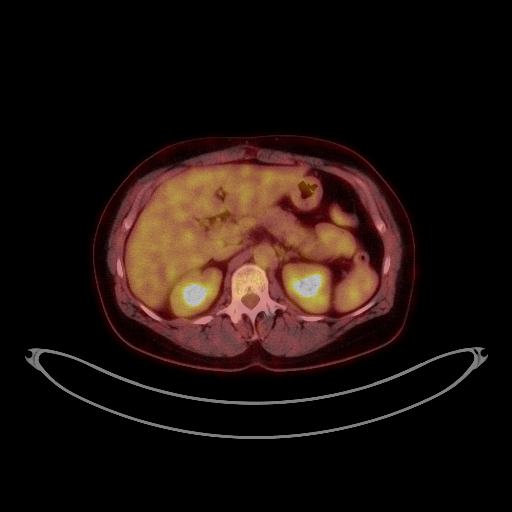
[im 152/203]
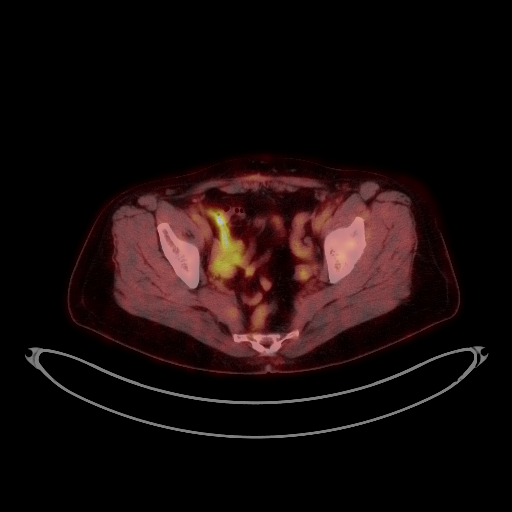
[im 203/203]
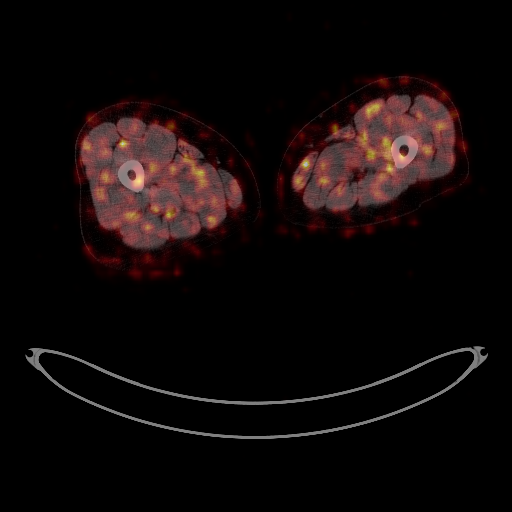

[Series 1060: results mm oncology reading · 1.0mm · 0.89mm/px · 1 of 1 slices shown]
[im 1/1]
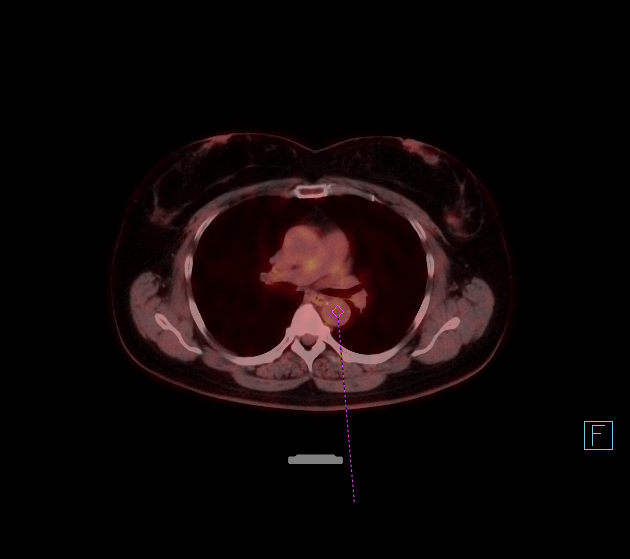

[25 of 25 positions shown; findings below may reference images not displayed]

FINDINGS: Mediastinal blood pool activity: SUV max

Liver activity: SUV max NA

NECK: No hypermetabolic lymph nodes in the neck.

Incidental CT findings: none

CHEST: Small nodule along the anterior margin of the LEFT horizontal
remeasured. Lesion does not have associated metabolic activity
although the size is below the limit for accurate PET
characterization.

No hypermetabolic mediastinal lymph nodes.

Incidental CT findings: none

ABDOMEN/PELVIS: No abnormal hypermetabolic activity within the
liver, pancreas, adrenal glands, or spleen. No hypermetabolic lymph
nodes in the abdomen or pelvis.

Incidental CT findings: Low-attenuation liver suggests hepatic
steatosis

SKELETON: No focal hypermetabolic activity to suggest skeletal
metastasis.

Incidental CT findings: none
IMPRESSION: 1. No metabolic activity associated with small LEFT upper lobe
pulmonary nodule. Consult recommendations on lung cancer screening
from CT 05/07/2019 " Additional imaging evaluation or consultation
with pulmonology or Thoracic Surgery recommended. Initial follow up
low-dose chest CT without contrast in 3 months (please use the
following order, "CT CHEST LCS NODULE FOLLOW-UP W/O CM") is
recommended at a minimum.  "
1. No evidence of metastatic disease.
2. Hepatic steatosis

## 2020-08-16 DIAGNOSIS — F1010 Alcohol abuse, uncomplicated: Secondary | ICD-10-CM | POA: Diagnosis not present

## 2020-08-24 ENCOUNTER — Other Ambulatory Visit: Payer: Self-pay

## 2020-08-24 ENCOUNTER — Telehealth: Payer: Self-pay | Admitting: Gastroenterology

## 2020-08-24 MED ORDER — CLENPIQ 10-3.5-12 MG-GM -GM/160ML PO SOLN: 1.0000 | ORAL | 0 refills | Status: DC

## 2020-08-24 MED ORDER — SUPREP BOWEL PREP KIT 17.5-3.13-1.6 GM/177ML PO SOLN: 1.0000 | ORAL | 0 refills | Status: DC

## 2020-08-24 NOTE — Telephone Encounter (Signed)
NAME: Patricia [name]   DOB: 25-Aug-1960 MRN: [id number] Procedure Date: 08-28-2020 Arrival Time: 7am Procedure Time: 8am   Location of Procedure: [facility] (4th Floor)                                         [place], [place] [address]  [facility] open at 645 am   Check in at the receptionist's desk on the 4th floor before having a seat in the main lobby.  PREPARATION FOR COLONOSCOPY WITH CLENPIQ  Please pick up your prep from the pharmacy within 2-3 days of getting prep instructions.  Please do not wait until the day before your procedure to pick it up.  On 08-23-2020 (5 days prior to the procedure) stop eating nuts, seeds, popcorn, corn, beans, peas, salads, or any raw vegetables.  Do not take any fiber supplements (e.g. Metamucil, Citrucel, and Benefiber).   THE DAY BEFORE PROCEDURE:   DATE: 08-27-2020   DAY: Sunday  1. Drink clear liquids the entire day - NO SOLID FOOD  2. Do not drink anything colored red or purple. Avoid juices with pulp. No orange juice, grapefruit, or tomato juice.  3. Drink at least 64 oz. (8 glasses) of fluid/clear liquids during the day to prevent dehydration and help the prep work efficiently.  4. Drink one 8-10 oz glass of any clear liquid once an hour   Clear liquids include:    NO RED NO PURPLE   Water Jello  Ice Popsicles  Tea (sugar ok, no milk/cream) Powdered fruit flavored drinks  Coffee (sugar ok, no milk/cream) Gatorade  Juice: apple, white grape, white cranberry Lemonade  Clear bullion, consomme, broth Carbonated beverages (any kind)  Strained chicken noodle soup  (no noodles or chicken) Hard Candy    FOLLOW THESE PREP INSTRUCTIONS, NOT INSTRUCTIONS ON PREP BOX.  ** At 6:00pm:   Step 1: Drink one 5.4 oz bottle of CLENPIQ Step 2: Drink five (5) 8 oz. glasses of clear liquid over the next 1-2 hours.   You may continue to drink additional clear  liquids until bedtime.  ONLY DRINK ONE BOTTLE ( OR HALF)  OF PREP THE EVENING BEFORE YOUR PROCEDURE.   THE SECOND BOTTLE (OR HALF)  MUST BE 5 HOURS BEFORE THE PROCEDURE ON THE DAY OF THE COLONOSCOPY.  _____________________________________________________________________  DAY OF PROCEDURE:   DATE: 08-28-2020       DAY: Monday  STAY ON CLEAR LIQUIDS, NO SOLID FOOD  Beginning at  3am  (5 hours before the procedure):  Follow the instructions below.   Step 1:Drink the second 5.4 oz bottle of CLENPIQ Step 2: Drink five  (5) 8 oz glasses of clear liquids over the next 1-2 hours  You may drink clear liquids until 5am (3 hours before procedure).   Stop ALL liquids 3 hours before procedure. You should not have any gum, water, medicine or candy after this 3 hour stop time.  _______________________________________________________________________  OTHER PROCEDURE INSTRUCTIONS  MEDICATION INSTRUCTIONS  Unless otherwise instructed, you should take regular prescription medications with a small sip of water as early as possible the morning of your procedure.  Take allowed medicines by 5am  Additional medication instructions: If you use any type of inhaler please bring them with you to your procedure.    Call office [phone]) if you have fever 2 days before procedure  CARE PARTNER   You will need a responsible adult at least 58 years of age to act as your care partner on the day of your procedure. This person needs to arrive with you to the facility, stay here during your procedure and then drive you home afterwards.  We cannot start your procedure unless your care partner is present in our facility. The total time from sign in until discharge is approximately 2-3 hours.  Before you leave, your doctor will review the findings and recommendations with you (and your care partner, if you give permission). Due to the COVID-19 pandemic we are asking patients to follow these guidelines. Please only bring  one care partner. Please be aware that your care partner may wait in the car in the parking lot or if they feel like they will be too hot or cold to wait in the car, they may wait in the lobby on the 4th floor. All care partners are required to wear a mask the entire time (we do not have any that we can provide them), they need to practice social distancing, and we will do a Covid check for all patient's and care partners when you arrive. Also we will check their temperature and your temperature. If the care partner waits in their car they need to stay in the parking lot the entire time and we will call them on their cell phone when the patient is ready for discharge so they can bring the car to the front of the building. Also all patient's will need to wear a mask into building  WHAT TO WEAR/BRING   Wear loose fitting clothing that is easily removed. Do not wear any lotions, perfumes or colognes. You may wear deodorant. Leave jewelry and other valuables at home. However, you may wish to bring a book to read or another electronic device to listen to music as you wait for your procedure to start.  Your belongings will be given to your care partner before you go to the procedure room.  Remove all body piercing jewelry and leave at home.  You should not wear any red or dark colored fingernail polish.  Use of personal electronic devices such as cell phones, smart watches, Fitbits, iPods, iPads, MP3 players, audio recorders, etc will not be allowed past the waiting area. Patients will be prompted to leave their device in the waiting area when they are called back to begin the admitting process.   WHAT TO EXPECT AFTER THE PROCEDURE   You may notice some feelings of bloating in the abdomen or passage of more gas than usual.  Walking can help get rid of the air that was put into your GI tract during the procedure and reduce the bloating. You may notice spotting of blood in your stool or on the toilet paper. Since you  completed a bowel prep for your procedure, you may not have a normal bowel movement for a few days.  DIET   In general, your first meal following the procedure should be a light meal after which it is okay to return to your normal diet.  A half-sandwich or bowl of soup is an example of a good first meal.  Heavy or fried foods are harder to digest and may make  you feel nauseous or bloated.  Drink plenty of fluids but you should avoid alcoholic beverages for 24 hours. These diet instructions may be modified depending on the results of your procedure.  ACTIVITY   Your care partner should take you home directly after the procedure.  You should plan to take it easy, moving slowly for the rest of the day.  You can resume normal activity the day after the procedure however you should NOT DRIVE or use heavy machinery for 24 hours (because of the sedation medicines used during the procedure).    FOLLOW UP AFTER YOUR PROCEDURE The next business day following your procedure, our staff will call the home or cell phone number listed on your records to check on you and address any questions or concerns that you may have.  If there is no answer at the number you provide [place], and we have not heard from you through the emergency physician on call, we will assume that you have returned to your regular daily activities without incident.   If any biopsies are taken during your procedure, you will be contacted by phone or by letter within the following 2-3 weeks.  Please call your gastroenterologist at [phone] if you have not heard about the biopsies in 3 weeks.   CANCELLATION POLICY  We need ample time to take care of all of our patients, therefore, we require 2 full business days notice for non-emergent cancellations of any procedure.  Failure to give 2 full days notice may result in a fee:  $100 for a single procedure (upper or lower endoscopy) $200 for a double procedure (upper and lower endoscopy)  If the  day of your procedure is:         You need to notify us by 5pm on: Monday         Wednesday Tuesday         Thursday Wednesday         Friday Thursday         Monday Friday          Tuesday   FREQUENTLY ASKED QUESTIONS  WHEN DO I START THE PREP? Please refer to your personalized prep instructions regarding the start time.  We realize, however, that you may work until later than this time. If so, you can begin taking your preparation as soon as you get home.  Remember that the later you start, the later you will be up going to the bathroom. HOW CAN I IMPROVE THE TASTE OF THE PREP? All the solutions have a salty aftertaste. You can try any one or all of these suggestions to improve or overcome this taste: . Hold hard candy (not red or purple) in your mouth while drinking the solution. Cheri Rous each glass with swallows of another beverage (juice, Coke, etc.). Celene Skeen on a Popsicle or sucker while drinking the solution. Sarina Ser flavored gum while drinking the solution. YOU MAY USE A STRAW. WHAT IF I GET SICK DURING THE PREP?  Stop drinking the solution and wait for 30-45 minutes. Let your system settle down. Try drinking small sips of Coke or other beverage.  Begin the solution again, using some of the suggestions above if the flavor is the problem.  WHEN WILL THE PREP START TO WORK?  Everyone is different in the amount of time that it takes the purge (laxative) to work. Some people begin to stool in the first hour, others not until the fourth hour or later. Activity  is helpful in stimulating the bowel so, if possible, do not sit and wait for the bowel to act - remain active.  DO I HAVE TO DRINK ALL OF THE PREP?  Yes. In order to give you the best examination possible, it is important to drink all of the solution in the set amount of time.  Some of the preps are "split dose," and are designed to work best if you drink the prep in two sittings. Your personalized intructions above will explain that in  full detail. In the event that you have tried everything suggested and still cannot complete the preparation, please call us at [phone].  If it is after hours or on the weekend, the answering service will reach the doctor on call for you. WHAT TYPES OF SEDATION ARE USED AT THE [facility]? There are two types of sedation.  Your gastroenterologist will decide which type you will need based on your personal medical issues as well as their own preference.  Moderate Sedation is achieved using a combination of an IV narcotic (fentanyl, Sublimaze) and an IV anxiolytic (midazolam, Versed). This results in a depressed state of consciousness which will allow you to be very relaxed and comfortable during the procedure but still able to respond to stimuli if needed.  You should not remember the procedure and will likely not remember the discharge instructions or discussion with the MD after the test is over.  You cannot drive or operate heavy machinery for 24 hours following the procedure.  You will not receive a separate bill for this type of sedation.  Occasionally this type of sedation is less effective than deep sedation for patients on certain chronic medications (pain medicines, antidepressants, antianxiety medicines) or in patients with a history of chronic alcohol use.  Deep Sedation (Monitored Anesthesia Care) is achieved using a short acting IV anesthetic (diprivan, Propofol) that promotes relaxation and sleep.  This medicine is administered by a CRNA (nurse anesthetist) skilled and credentialed in [place].  You should not remember the procedure itself, but you should be able to remember the discharge instructions and the discussion with your physician afterwards. You cannot drive or operate heavy machinery for 24 hours following the procedure. You will receive a separate bill for this type of sedation.  This type of sedation is generally more reliable for patients who take certain chronic medications or  with certain medical conditions and so it may be preferred.  If you have any questions about the type of sedation that will be used for your procedure, your gastroenterologist will be happy to discuss it with you. WHY DOES MY CARE PARTNER HAVE TO BE IN THE [place] DURING MY PROCEDURE? Endoscopic procedures are generally safe, but due to the risk of possible complications associated with the procedure and anesthesia it is our policy that someone be present during the procedure who will act as your spokesperson should the need arise for emergency intervention.  We cannot start the procedure unless your care partner is present in the facility waiting room. WHY DO I NEED A DRIVER?  The medicines used for your sedation cause delayed reflexes, impair thinking and judgment, and have some amnesic effect, therefore affecting your ability to drive safely. Even though you may feel alright, you are instructed to refrain from driving, operating any type of machinery, making any critical decisions, or signing any legal documents for 24 hours following your procedure.  WHAT SHOULD I BRING WITH ME?  Leave jewelry, purses and wallets at home.  Wear loose fitting, easily removed clothing (e.g. no pantyhose or girdles).  You may wish to bring a book to read or an electronic device to listen to music as you wait for your procedure to start.  CAN I WEAR DENTURES?  Yes, you may wear your dentures. However, you may be asked to remove them prior to your procedure.  CAN I  WEAR MY CONTACTS?  We advise that you leave your contact lenses at home and wear your glasses instead. If you do wear you lenses, you may be asked to remove them prior to your procedure so please bring a case for them and a pair of glasses to wear after your procedure.  IS THE TEST SAFE DURING MY MENSTRUAL PERIOD?  Yes, your procedure can still be performed.  WILL THE DOCTOR TALK WITH ME AFTERWARDS? Yes, your physician will review the findings, follow-up care  instructions, and treatment recommendations with you.  This will also be reviewed with your care partner if you have explicitly given [place] permission.  It is important that your care partner realizes you may not remember much after the procedure due to the effects of the anesthesia and so they will need to be able to review this information with you later. HOW LONG WILL I BE THERE? The whole process takes 2-3 hours.  Most of that time is spent checking you in and waking you up safely after the procedure.  The colonoscopy itself takes 20-30 minutes usually.  Obviously, there may be unforseen delays and we will appreciate your patience if that occurs. WHAT IF THE WEATHER IS TERRIBLE?  We will attempt to contact you by phone or mychart in the even of a weather related closing or delayed opening.  Please CONTACT OUR OFFICE WHEN SEVERE WEATHER IS FORECAST so we may advise you of our hours of operation.  Even when roads may be passable in your area, our facility may be closed.  [facility] news will broadcast our weather closings and updates.  If you choose to cancel due to severe weather, the cancellation fee would not apply.    YOUR FINANCIAL RESPONSIBILITY  If you have insurance, you will need to contact your insurance company to verify that you have active coverage and to determine the amount of coverage they will provide. It is important to tell them that you are having the procedure performed at an [facility]). They can tell you what portion of the cost will be your responsibility, usually expressed either as a fixed amount or as a percentage of overall cost. We will also be contacting your insurance company with information about your procedure in order to obtain pre-certification.  You must contact your insurance company as well; failure to do so may result in you having to pay a greater portion of the cost or even the total cost of the procedure.  YOU MAY RECEIVE THE FOLLOWING BILLS 1. The  [facility] will bill a facility fee for use of the procedure room, medication and supplies. 2. Your [facility] gastroenterologist will bill a professional fee for performing the procedure. 3. If a biopsy is performed you will receive a bill from [facility] for their professional fee and a bill from [employer] for processing the pathology sample. 4. If you receive diprivan for sedation during your procedure, you will receive a bill from [facility] for the administration of the medication.  Complaints or questions regarding billing, payment by third party payers or payment plans  can be directed to the Customer Service Department of [facility], [address] [place], [place], [place] [address].  Phone inquiries may be made at [phone], Monday through Friday 8 a.m. to 5 p.m., or you may visit the [facility] website at: [email or web address], click on "For Patients", then click "Questions about your bill."   [facility] ([place])                                 The [facility] ([place]) is an independent, freestanding [place]) located on the fourth floor of the [facility] at [place], [place].  It is licensed by the [place], certified by [employer] and is accredited by [employer].   The [facility] physicians established the [facility] in 1992. The physicians of [employer] joined the [facility] in 1999, and [place] is now owned by [employer].  We completed a major renovation in 2006 and again in 2020 at which time we expanded [place] to provide greater privacy and comfort for our patients and their family members.  We have invested in state-of-the-art facilities and equipment to ensure that our patients receive the most up to date and best care.  At the [facility], board-certified  gastroenterologists perform elective diagnostic and therapeutic endoscopic procedures such as endoscopy and colonoscopy.  Hours of operation are from 7:30 a.m. to 6 p.m. Monday - Friday.  Outside of the posted hours of operation, urgent or emergent care is provided at [place]. [place] physicians also provide 24-hour emergency coverage.  After hours and on weekends, the on-call physician may be reached by calling [phone].  The answering service will take a message and have the physician on call contact you.  YOUR RIGHTS AS A PATIENT         Considerate, respectful, and safe care.  A discussion of your condition or illness, what we can do about it, and the likely outcome of care.  To know the names and the roles of people caring for you.  You can consent or refuse any treatment within the law throughout your stay.  The [facility] staff and doctors will protect your privacy as much as possible.  Your health records are confidential unless you give permission for [place] to release them or law requires them to be released.  When the [facility] releases your records to others, like your insurance company, we ask them to maintain confidentiality. You can review your records and ask questions about them unless restricted by law.  You can expect that the staff will give you needed health care to the best of their ability.  Treatment, referral, or transfer may be recommended.  If a transfer is needed, you will be told of the risks, benefits and other options.  You have a right to know if the [facility] has relationships with outside parties that may influence your treatment or care.  These could be with educational facilities, insurers, or other healthcare givers.    You may consent or decline to take part in research.  If you decline, we will still provide you with the very best care.  You have the right to know about [place] rules that affect you, your care, charges and  payment methods.  You have the right to know about  [place] resources, such as the complaint process, that can help you resolve problems and questions about your stay and care.     YOUR RESPONSIBILITIES AS A PATIENT   You are responsible for giving [place] information about your health, such as past sickness, hospital stays and medications.  You are to ask questions when you do not understand information or instructions.  If you cannot follow through with your treatment, you must tell your doctor or nurse.  You and your visitors are responsible for being considerate of the needs of other patients, staff and the center.  You are responsible to provide information for insurance purposes and work with our billing staff to arrange for payment when necessary.  Your health depends on the decisions you make in your daily life.  You are responsible for recognizing the effect of your life style on your health.  You are asked to share your values, beliefs and traditions that help the staff in providing care that respects your values and dignity.  COMPLAINT/GRIEVANCE PROCESS   The [facility] recognizes that patients have the right to voice concerns without fear of discrimination or reprisal, and to have these concerns reviewed and responded to in a timely manner. L[facility] seeks to provide prompt review and timely resolution of complaints or grievances from any patient.    You may voice your concerns, complaints, or problems with the care you have received or are receiving to any staff member at any point in your care.  Every effort will be made to reconcile your concern or complaint while you are still in the [place].  However, if you wish to voice a concern or complaint after you have left the center, you may contact the Nursing Supervisor at [phone] or our Administrative Director of Gastroenterology at [phone].  If we are unable to satisfactorily address your complaint, you may contact the [place], Complaint Intake Unit ([address] [place], [place], [place], [place] [address], phone: [phone] or [phone]) or by e-mail at [email or web address].    You may also contact the Office of the Medicare Ombudsman to file a grievance at [place] ([phone]) or at [email or web address]   ADVANCE DIRECTIVE POLICY  The [facility] supports the adult patient's right to make decisions regarding the acceptance or refusal of medical and/or surgical treatments and recognizes Advance Directives as options to promote patient autonomy regarding treatment decisions.   However, due to the lack of a constant care relationship in the ambulatory care setting, if a patient should suffer a cardiac or respiratory arrest or other life-threatening situation, you and/or your healthcare power of attorney will be required to sign a form which implies consent for resuscitation and transfer to a higher level of care.  Therefore, the [facility] will not honor previously signed advance directives or verbal family agreements for any patient.  Should you not agree with the Center's policy on Advance Directives and decline to sign the form, your procedure cannot be performed in the [place].  However, the procedure may be scheduled in the hospital setting and performed by a gastroenterologist affiliated with [facility].   For applicable state laws and sample forms, you may contact the [employer] at [phone] for English and [phone] for other languages or via the web at https://www.turner-bruce.com/.  Other sources include the [place] of Aging and Adult Services 800 [phone] at http://massey-hart.com/ or www.[place].org at 919  562-1308 or  www.nclifelinks.org or [email or web address].  SIGNATURES/CONFIDENTIALITY  You have signed paperwork which  will be entered into your electronic medical record.  This attests to the fact that that the information above on has been reviewed and is understood.  Full responsibility of the confidentiality of the printed copy of this After Visit Summary lies with you and/or your care-partner.

## 2020-08-24 NOTE — Telephone Encounter (Signed)
Rx sent to pharmacy for suprep as requested.

## 2020-08-24 NOTE — Telephone Encounter (Signed)
Patient aware that insurance would not cover Suprep and Clenpiq was sent to pharmacy instead.  Patient instructions were discussed with patient over the phone as date and times are different due to rescheduling.  Patient agreed to plan and verbalized understanding.  No further questions.

## 2020-08-24 NOTE — Addendum Note (Signed)
Addended by: Stevan Born on: 08/24/2020 03:06 PM   Modules accepted: Orders

## 2020-08-28 ENCOUNTER — Ambulatory Visit (AMBULATORY_SURGERY_CENTER): Payer: BC Managed Care – PPO | Admitting: Gastroenterology

## 2020-08-28 ENCOUNTER — Encounter: Payer: Self-pay | Admitting: Gastroenterology

## 2020-08-28 ENCOUNTER — Other Ambulatory Visit: Payer: Self-pay

## 2020-08-28 VITALS — BP 117/74 | HR 70 | Temp 97.5°F | Resp 22 | Ht 59.0 in | Wt 149.0 lb

## 2020-08-28 DIAGNOSIS — R1310 Dysphagia, unspecified: Secondary | ICD-10-CM

## 2020-08-28 DIAGNOSIS — K222 Esophageal obstruction: Secondary | ICD-10-CM | POA: Diagnosis not present

## 2020-08-28 DIAGNOSIS — K648 Other hemorrhoids: Secondary | ICD-10-CM | POA: Diagnosis not present

## 2020-08-28 DIAGNOSIS — K922 Gastrointestinal hemorrhage, unspecified: Secondary | ICD-10-CM | POA: Diagnosis not present

## 2020-08-28 DIAGNOSIS — Z1211 Encounter for screening for malignant neoplasm of colon: Secondary | ICD-10-CM | POA: Diagnosis not present

## 2020-08-28 DIAGNOSIS — Z86010 Personal history of colonic polyps: Secondary | ICD-10-CM

## 2020-08-28 DIAGNOSIS — K635 Polyp of colon: Secondary | ICD-10-CM

## 2020-08-28 DIAGNOSIS — D123 Benign neoplasm of transverse colon: Secondary | ICD-10-CM

## 2020-08-28 DIAGNOSIS — K449 Diaphragmatic hernia without obstruction or gangrene: Secondary | ICD-10-CM

## 2020-08-28 DIAGNOSIS — D122 Benign neoplasm of ascending colon: Secondary | ICD-10-CM

## 2020-08-28 DIAGNOSIS — D124 Benign neoplasm of descending colon: Secondary | ICD-10-CM

## 2020-08-28 DIAGNOSIS — K219 Gastro-esophageal reflux disease without esophagitis: Secondary | ICD-10-CM

## 2020-08-28 MED ORDER — SODIUM CHLORIDE 0.9 % IV SOLN: 500.0000 mL | Freq: Once | INTRAVENOUS | Status: DC

## 2020-08-28 MED ORDER — OMEPRAZOLE 40 MG PO CPDR: 40.0000 mg | DELAYED_RELEASE_CAPSULE | Freq: Every day | ORAL | 11 refills | Status: AC

## 2020-08-28 NOTE — Op Note (Signed)
[place] Patient Name: Patricia [name] Procedure Date: 08/28/2020 7:37 AM MRN: [id number] Endoscopist: Milus Banister , MD Age: 60 Referring MD:  Date of Birth: [birth year] Gender: Female Account #: [id number] Procedure:                Colonoscopy Indications:              Hematochezia; Several polyps over many years (in                            [place]) with multiple HP polyps removed. Colonoscopy                            2014 two subCM HPs removed. Medicines:                Monitored Anesthesia Care Procedure:                Pre-Anesthesia Assessment:                           - Prior to the procedure, a History and Physical                            was performed, and patient medications and                            allergies were reviewed. The patient's tolerance of                            previous anesthesia was also reviewed. The risks                            and benefits of the procedure and the sedation                            options and risks were discussed with the patient.                            All questions were answered, and informed consent                            was obtained. Prior Anticoagulants: The patient has                            taken no previous anticoagulant or antiplatelet                            agents. ASA Grade Assessment: II - A patient with                            mild systemic disease. After reviewing the risks                            and benefits, the patient was deemed in  satisfactory condition to undergo the procedure.                           After obtaining informed consent, the colonoscope                            was passed under direct vision. Throughout the                            procedure, the patient's blood pressure, pulse, and                            oxygen saturations were monitored continuously. The                            Olympus CF-HQ190L  (Serial# [id number]) Colonoscope was                            introduced through the anus and advanced to the the                            cecum, identified by appendiceal orifice and                            ileocecal valve. The colonoscopy was performed                            without difficulty. The patient tolerated the                            procedure well. The quality of the bowel                            preparation was good. The ileocecal valve,                            appendiceal orifice, and rectum were photographed. Scope In: 8:04:11 AM Scope Out: 8:14:07 AM Scope Withdrawal Time: 0 hours 7 minutes 43 seconds  Total Procedure Duration: 0 hours 9 minutes 56 seconds  Findings:                 Three sessile polyps were found in the descending                            colon, transverse colon and ascending colon. The                            polyps were 2 to 7 mm in size. These polyps were                            removed with a cold snare. Resection and retrieval                            were complete.  External and internal hemorrhoids were found. The                            hemorrhoids were medium-sized.                           The exam was otherwise without abnormality on                            direct and retroflexion views. Complications:            No immediate complications. Estimated blood loss:                            None. Estimated Blood Loss:     Estimated blood loss: none. Impression:               - Three 2 to 7 mm polyps in the descending colon,                            in the transverse colon and in the ascending colon,                            removed with a cold snare. Resected and retrieved.                           - External and internal hemorrhoids.                           - The examination was otherwise normal on direct                            and retroflexion views. Recommendation:            - EGD now.                           - Await pathology results. Milus Banister, MD 08/28/2020 8:28:45 AM This report has been signed electronically.

## 2020-08-28 NOTE — Patient Instructions (Signed)
Handouts provided on polyps, hemorrhoids, hemorrhoid banding, hiatal hernia and post-dilation diet.   Start Omeprazole 40mg   Pills, take one pill about 30 minutes before breakfast every morning to help your heartburn and to reduce the chances of you having further swallowing troubles.   Will refer you to Dr. Carlean Purl for hemorrhoid banding procedure. My office will contact you.   YOU HAD AN ENDOSCOPIC PROCEDURE TODAY AT [facility]:   Refer to the procedure report that was given to you for any specific questions about what was found during the examination.  If the procedure report does not answer your questions, please call your gastroenterologist to clarify.  If you requested that your care partner not be given the details of your procedure findings, then the procedure report has been included in a sealed envelope for you to review at your convenience later.  YOU SHOULD EXPECT: Some feelings of bloating in the abdomen. Passage of more gas than usual.  Walking can help get rid of the air that was put into your GI tract during the procedure and reduce the bloating. If you had a lower endoscopy (such as a colonoscopy or flexible sigmoidoscopy) you may notice spotting of blood in your stool or on the toilet paper. If you underwent a bowel prep for your procedure, you may not have a normal bowel movement for a few days.  Please Note:  You might notice some irritation and congestion in your nose or some drainage.  This is from the oxygen used during your procedure.  There is no need for concern and it should clear up in a day or so.  SYMPTOMS TO REPORT IMMEDIATELY:   Following lower endoscopy (colonoscopy or flexible sigmoidoscopy):  Excessive amounts of blood in the stool  Significant tenderness or worsening of abdominal pains  Swelling of the abdomen that is new, acute  Fever of 100F or higher   Following upper endoscopy (EGD)  Vomiting of blood or coffee ground material  New  chest pain or pain under the shoulder blades  Painful or persistently difficult swallowing  New shortness of breath  Fever of 100F or higher  Black, tarry-looking stools  For urgent or emergent issues, a gastroenterologist can be reached at any hour by calling [phone]. Do not use MyChart messaging for urgent concerns.    DIET:  Post-dilation diet: Clear liquids for 2 hours (until 10:30am). Then a Soft diet (see handout) for the rest of today.  Drink plenty of fluids but you should avoid alcoholic beverages for 24 hours.  ACTIVITY:  You should plan to take it easy for the rest of today and you should NOT DRIVE or use heavy machinery until tomorrow (because of the sedation medicines used during the test).    FOLLOW UP: Our staff will call the number listed on your records 48-72 hours following your procedure to check on you and address any questions or concerns that you may have regarding the information given to you following your procedure. If we do not reach you, we will leave a message.  We will attempt to reach you two times.  During this call, we will ask if you have developed any symptoms of COVID 19. If you develop any symptoms (ie: fever, flu-like symptoms, shortness of breath, cough etc.) before then, please call [phone].  If you test positive for Covid 19 in the 2 weeks post procedure, please call and report this information to [place].    If any biopsies were taken  you will be contacted by phone or by letter within the next 1-3 weeks.  Please call us at [phone] if you have not heard about the biopsies in 3 weeks.    SIGNATURES/CONFIDENTIALITY: You and/or your care partner have signed paperwork which will be entered into your electronic medical record.  These signatures attest to the fact that that the information above on your After Visit Summary has been reviewed and is understood.  Full responsibility of the confidentiality of this discharge information lies with you  and/or your care-partner.

## 2020-08-28 NOTE — Progress Notes (Signed)
Called to room to assist during endoscopic procedure.  Patient ID and intended procedure confirmed with present staff. Received instructions for my participation in the procedure from the performing physician.  

## 2020-08-28 NOTE — Op Note (Signed)
[facility] Patient Name: Patricia [name] Procedure Date: 08/28/2020 8:17 AM MRN: [id number] Endoscopist: Milus Banister , MD Age: 60 Referring MD:  Date of Birth: [birth year] Gender: Female Account #: [id number] Procedure:                Upper GI endoscopy Indications:              Dysphagia Medicines:                Monitored Anesthesia Care Procedure:                Pre-Anesthesia Assessment:                           - Prior to the procedure, a History and Physical                            was performed, and patient medications and                            allergies were reviewed. The patient's tolerance of                            previous anesthesia was also reviewed. The risks                            and benefits of the procedure and the sedation                            options and risks were discussed with the patient.                            All questions were answered, and informed consent                            was obtained. Prior Anticoagulants: The patient has                            taken no previous anticoagulant or antiplatelet                            agents. ASA Grade Assessment: II - A patient with                            mild systemic disease. After reviewing the risks                            and benefits, the patient was deemed in                            satisfactory condition to undergo the procedure.                           - Prior to the procedure, a History and Physical  was performed, and patient medications and                            allergies were reviewed. The patient's tolerance of                            previous anesthesia was also reviewed. The risks                            and benefits of the procedure and the sedation                            options and risks were discussed with the patient.                            All questions were answered, and informed  consent                            was obtained. Prior Anticoagulants: The patient has                            taken no previous anticoagulant or antiplatelet                            agents. ASA Grade Assessment: II - A patient with                            mild systemic disease. After reviewing the risks                            and benefits, the patient was deemed in                            satisfactory condition to undergo the procedure.                           After obtaining informed consent, the endoscope was                            passed under direct vision. Throughout the                            procedure, the patient's blood pressure, pulse, and                            oxygen saturations were monitored continuously. The                            Endoscope was introduced through the mouth, and                            advanced to the second part of duodenum. The upper  GI endoscopy was accomplished without difficulty.                            The patient tolerated the procedure well. Scope In: Scope Out: Findings:                 One benign-appearing, intrinsic mild stenosis was                            found at the gastroesophageal junction (Mild peptic                            appearing stricture). A TTS dilator was passed                            through the scope. Dilation with an 18-19-20 mm                            balloon dilator was performed to 20 mm.                           A small hiatal hernia was present.                           The exam was otherwise without abnormality. Complications:            No immediate complications. Estimated blood loss:                            None. Estimated Blood Loss:     Estimated blood loss: none. 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 Dilated to 26mm today.                           -  Small hiatal hernia.                           - The examination was otherwise normal. Recommendation:           - Patient has a contact number available for                            emergencies. The signs and symptoms of potential                            delayed complications were discussed with the                            patient. Return to normal activities tomorrow.                            Written discharge instructions were provided to the  patient.                           - Resume previous diet.                           - Continue present medications. New start                            omeprazole 40mg  pills, one pill shortly before                            breakfast every morning, disp 30 with 11 refills.                            This will help your heartburn and reduce the                            chances of you having further swallowing troubles. Milus Banister, MD 08/28/2020 8:33:06 AM This report has been signed electronically.

## 2020-08-28 NOTE — Progress Notes (Signed)
PT taken to PACU. Monitors in place. VSS. Report given to RN. 

## 2020-08-28 NOTE — Progress Notes (Signed)
JB- Check-in  CW - VS   

## 2020-08-30 ENCOUNTER — Telehealth: Payer: Self-pay

## 2020-08-30 NOTE — Telephone Encounter (Signed)
  Follow up Call-  Call back number 08/28/2020  Post procedure Call Back phone  # [phone] cell  Permission to leave phone message Yes  Some recent data might be hidden     Patient questions:  Do you have a fever, pain , or abdominal swelling? No. Pain Score  0 *  Have you tolerated food without any problems? Yes.    Have you been able to return to your normal activities? Yes.    Do you have any questions about your discharge instructions: Diet   No. Medications  No. Follow up visit  No.  Do you have questions or concerns about your Care? No.  Actions: * If pain score is 4 or above: 1. No action needed, pain <4.Have you developed a fever since your procedure? no  2.   Have you had an respiratory symptoms (SOB or cough) since your procedure? no  3.   Have you tested positive for COVID 19 since your procedure no  4.   Have you had any family members/close contacts diagnosed with the COVID 19 since your procedure?  no   If yes to any of these questions please route to Joylene John, RN and Joella Prince, RN

## 2020-08-31 IMAGING — CR DG CHEST 2V
2 series · 2 of 2 positions shown · non-contrast
Comparison: Chest x-ray 06/09/2019.

CLINICAL DATA: 58-year-old female with history of left-sided chest
tube removal.

EXAM:
CHEST - 2 VIEW

[chest pa]
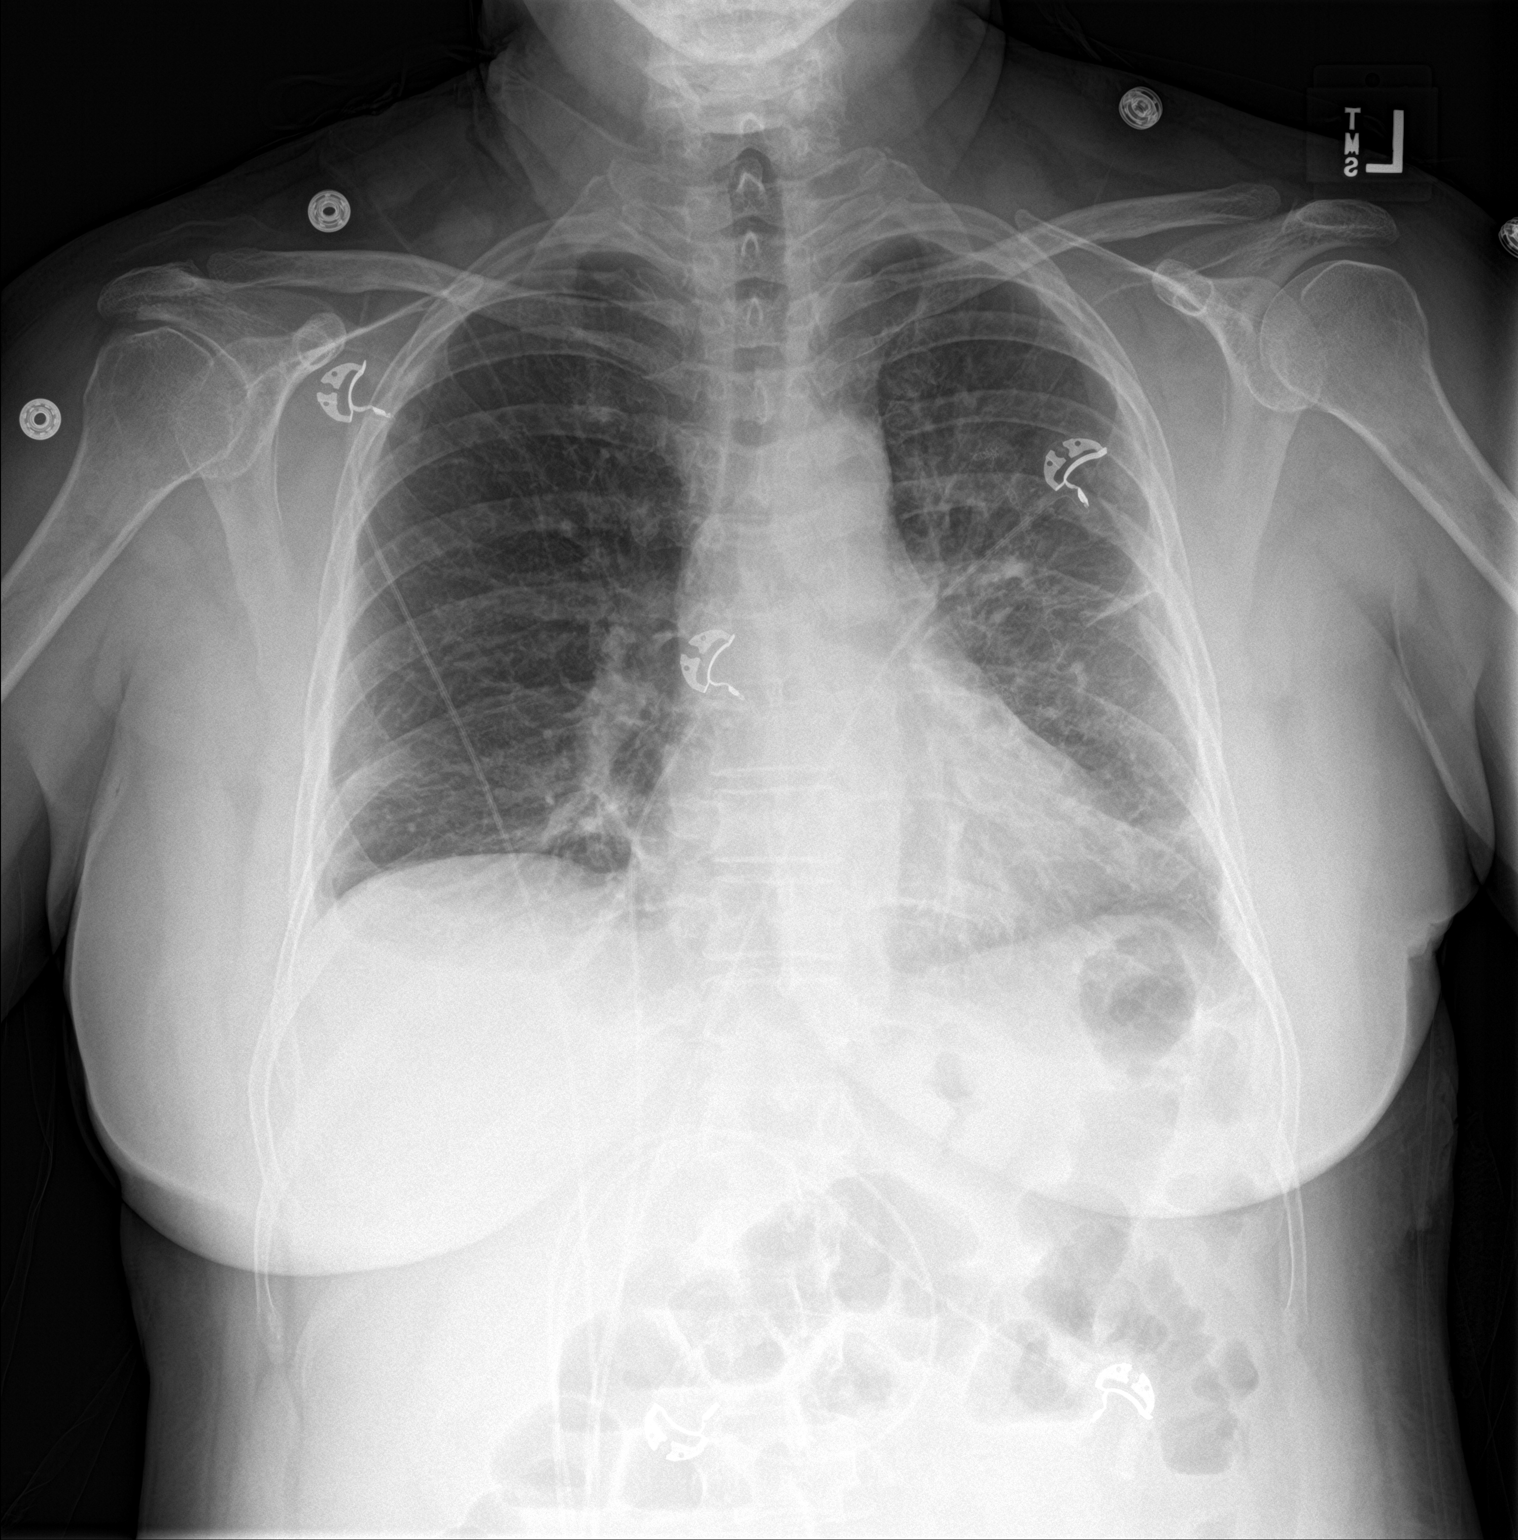

[chest lat]
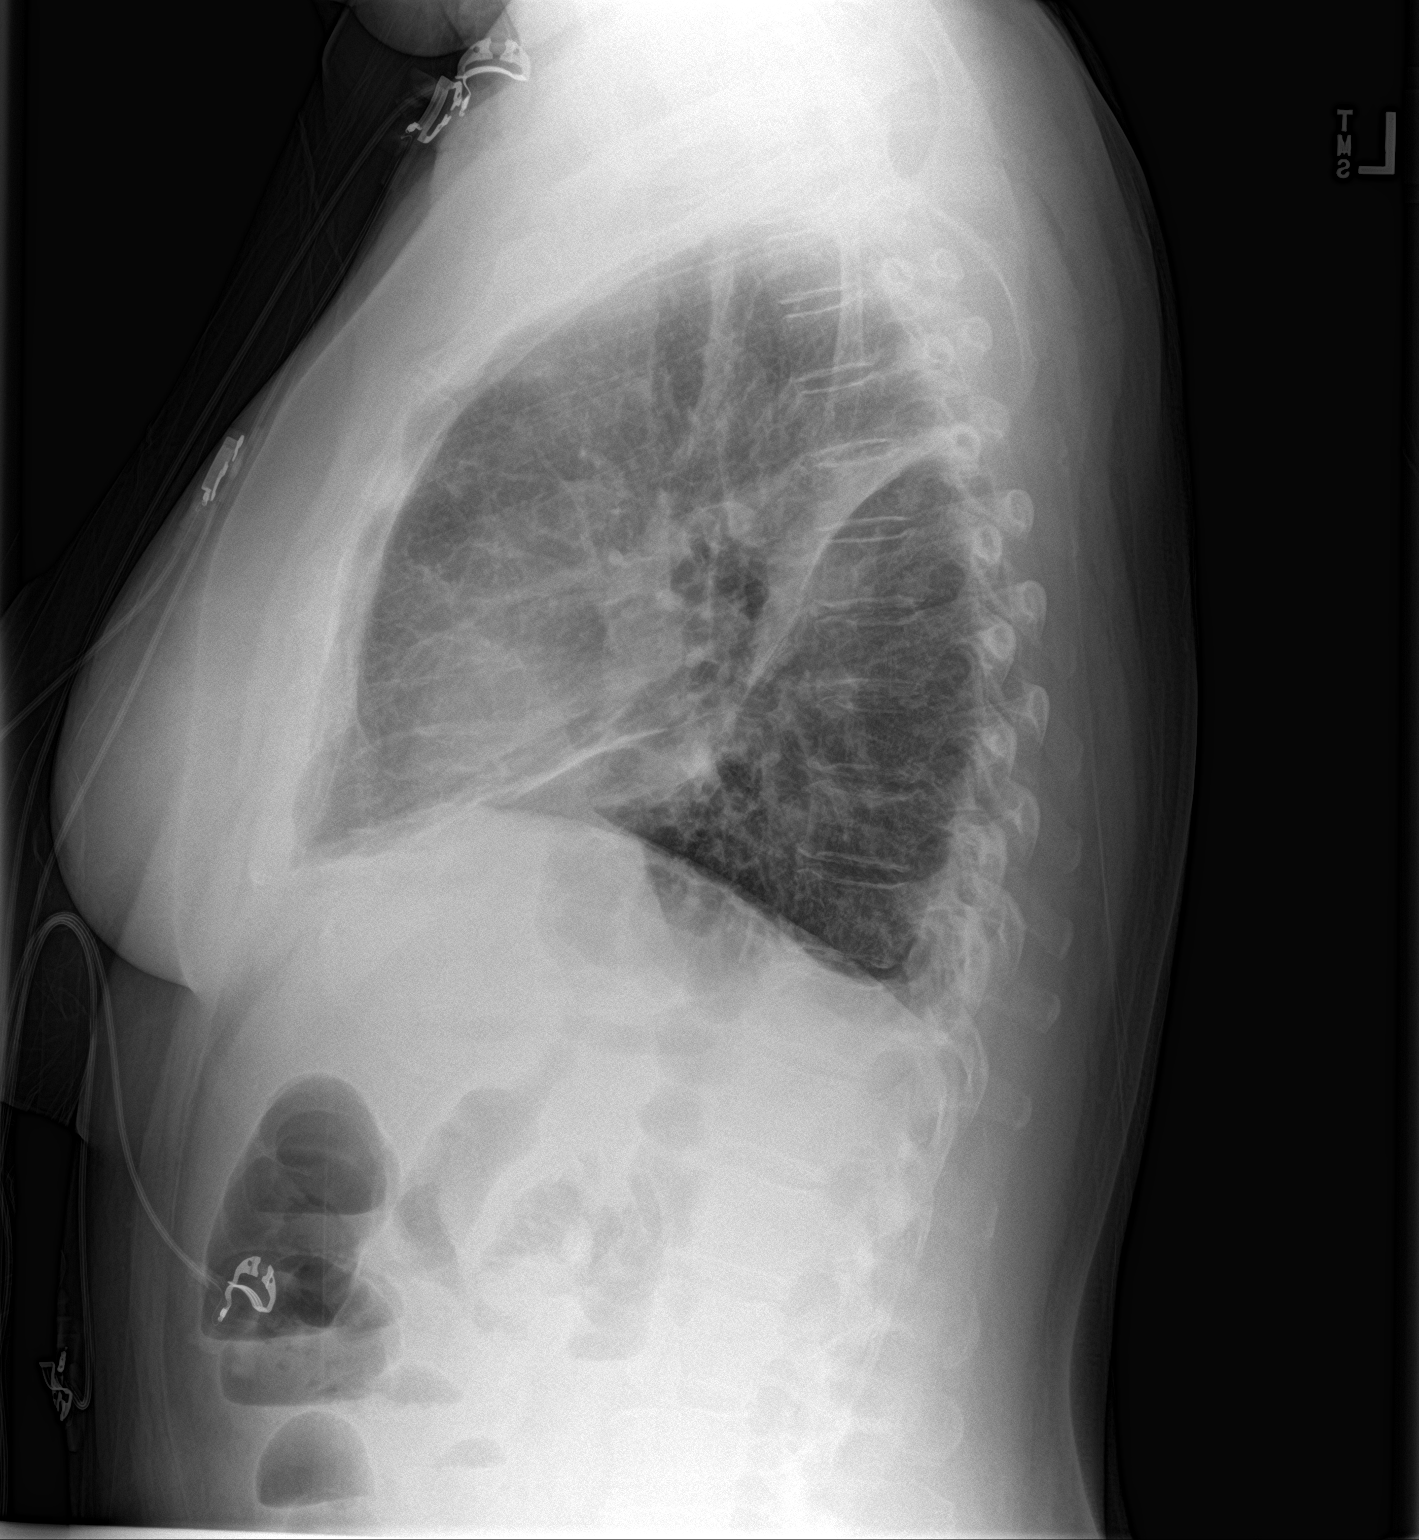

[2 of 2 positions shown; findings below may reference images not displayed]

FINDINGS: Previously noted right-sided internal jugular central venous
catheter has been removed. Lung volumes are low with multifocal
ill-defined somewhat linear opacities throughout the mid to lower
lungs bilaterally (left greater than right), favored to reflect
areas of atelectasis and/or scarring. No confluent consolidative
airspace disease. Trace bilateral pleural effusions. Postoperative
changes of wedge resection in the posterior aspect of the left upper
lobe abutting the major fissure. No evidence of pulmonary edema.
Heart size is normal. Upper mediastinal contours are within normal
limits.
IMPRESSION: 1. Removal of right IJ central venous catheter. No pneumothorax or
other complicating features.
2. Patchy areas of subsegmental atelectasis and/or scarring
throughout the mid to lower lungs bilaterally, similar to the prior
study.
3. Trace bilateral pleural effusions.

## 2020-09-04 ENCOUNTER — Encounter: Payer: Self-pay | Admitting: Gastroenterology

## 2021-02-19 ENCOUNTER — Telehealth: Payer: Self-pay | Admitting: Gastroenterology

## 2021-02-19 NOTE — Telephone Encounter (Signed)
Inbound call from patient. States her hemorrhoids are extremely painful and bleeding. Asking if she could be scheduled for banding? Best contact number [phone]

## 2021-02-20 NOTE — Telephone Encounter (Signed)
Appointment was scheduled 02/23/21 @ 2:50 with Dr. Carlean Purl for banding. Pt aware of appt

## 2021-02-20 NOTE — Telephone Encounter (Signed)
First available hemorrhoid banding provider in our office, fax

## 2021-02-20 NOTE — Telephone Encounter (Signed)
Please see note below and advise if OK for schedule for banding and with who you would like her to see.

## 2021-02-23 ENCOUNTER — Encounter: Payer: Self-pay | Admitting: Internal Medicine

## 2021-02-23 ENCOUNTER — Ambulatory Visit (INDEPENDENT_AMBULATORY_CARE_PROVIDER_SITE_OTHER): Payer: 59 | Admitting: Internal Medicine

## 2021-02-23 VITALS — BP 140/88 | HR 96 | Ht 60.0 in | Wt 157.0 lb

## 2021-02-23 DIAGNOSIS — K529 Noninfective gastroenteritis and colitis, unspecified: Secondary | ICD-10-CM | POA: Diagnosis not present

## 2021-02-23 DIAGNOSIS — K644 Residual hemorrhoidal skin tags: Secondary | ICD-10-CM

## 2021-02-23 DIAGNOSIS — F1020 Alcohol dependence, uncomplicated: Secondary | ICD-10-CM

## 2021-02-23 DIAGNOSIS — Z91040 Latex allergy status: Secondary | ICD-10-CM

## 2021-02-23 MED ORDER — HYDROCORTISONE (PERIANAL) 2.5 % EX CREA: 1.0000 "application " | TOPICAL_CREAM | Freq: Two times a day (BID) | CUTANEOUS | 1 refills | Status: AC

## 2021-02-23 MED ORDER — CHOLESTYRAMINE 4 G PO PACK: 4.0000 g | PACK | Freq: Every day | ORAL | 3 refills | Status: AC

## 2021-02-23 NOTE — Patient Instructions (Addendum)
If you are age 58 or older, your body mass index should be between 23-30. Your Body mass index is 30.66 kg/m. If this is out of the aforementioned range listed, please consider follow up with your Primary Care Provider.  If you are age 57 or younger, your body mass index should be between 19-25. Your Body mass index is 30.66 kg/m. If this is out of the aformentioned range listed, please consider follow up with your Primary Care Provider.   __________________________________________________________  The [facility] providers would like to encourage you to use [facility] to communicate with providers for non-urgent requests or questions.  Due to long hold times on the telephone, sending your provider a message by [facility] may be a faster and more efficient way to get a response.  Please allow 48 business hours for a response.  Please remember that this is for non-urgent requests.   Continue to try to stop drinking.  We have sent medications to your pharmacy for you to pick up at your convenience.  I appreciate the opportunity to care for you. Silvano Rusk, MD, [facility]

## 2021-02-23 NOTE — Progress Notes (Signed)
Patricia [name] 60 y.o. 11-02-1960 [id number]  Assessment & Plan:   Encounter Diagnoses  Name Primary?   External hemorrhoids with complication Yes   Chronic diarrhea    Alcoholism ([place])    Latex allergy     I am going to treat her hemorrhoids with hydrocortisone cream and we will treat her diarrhea with cholestyramine.  Ideally she should stop drinking but that does not seem to be possible at this time.  She is well aware and has been warned of the potential complications of alcoholism.  I do not think it makes sense to band her hemorrhoids, most of them are external and we will treat as above.  We considered additional work-up for the diarrhea but both of [place] think it is quite likely due to alcohol consumption and she is comfortable with this plan.  She can return to GI to Dr. Ardis [name] as needed for her GI symptoms if there are specific hemorrhoid issues I can see her back but it appears to me that most of her hemorrhoid problems are external and not amenable to banding.  Should she need banding given her latex sensitivity or allergy she should have nonlatex bands.  Subjective:   Chief Complaint: Problematic hemorrhoids  HPI The patient is a 60 year old woman with alcoholism and a history of colonic polyps and hemorrhoids who was referred by my colleague Dr. Ardis [name] after the patient called in with complaints of anal pain and symptomatic hemorrhoids.  The plan was for possible hemorrhoidal banding.  Patient has a long history of hemorrhoids and internal and external hemorrhoids were seen a colonoscopy in February of this year.  She relates that she has chronic loose stools several a day 3-5.  They can be watery.  Sounds like Bristol stool scale 5-7.  She does not seem to have food intolerances per se but when we talk about her overall diet history even though she "eats well" she has 10 alcoholic beverages a day.  Several white Russians and then moves to red wine.  She  recognizes that this is a problem and has been to rehab before but is now hoping for a psychedelic mushroom trial to see if that makes a difference.  She is unable to stop drinking she tells me.  She has had these loose stools for several years now along with the alcohol consumption.  As far as hemorrhoid symptoms she has anal swelling and protrusion chronically irritation itching soreness but no sharp pain and some rectal bleeding at times.  Especially after more frequent diarrhea.  She reports about 30 stools a week.  She denies constipation.  She has used some over-the-counter Preparation H but no prescription medications for her hemorrhoids.  She did not mention the symptoms of diarrhea to Dr. Ardis [name] when he saw her in February.  Her colonoscopy did not reveal any inflammatory changes and she had a diminutive SSP and 2 hyperplastic polyps removed.  She had an EGD at the same time, benign-appearing stricture dilated to 20 mm.  No varices no signs of liver disease.  Wt Readings from Last 3 Encounters:  02/23/21 157 lb (71.2 kg)  08/28/20 149 lb (67.6 kg)  05/30/20 149 lb 4 oz (67.7 kg)    Allergies  Allergen Reactions   Codeine Shortness Of Breath   Latex Other (See Comments)    Sensitive/red skin   Current Meds  Medication Sig   cholestyramine (QUESTRAN) 4 g packet Take 1 packet (4 g total) by mouth daily  with supper.   escitalopram (LEXAPRO) 20 MG tablet Take 20 mg by mouth daily.   famotidine (PEPCID) 20 MG tablet Take 20 mg by mouth at bedtime.   hydrocortisone (ANUSOL-HC) 2.5 % rectal cream Place 1 application rectally 2 (two) times daily.   losartan-hydrochlorothiazide (HYZAAR) 50-12.5 MG tablet Take 1 tablet by mouth daily.   Melatonin 5 MG TABS Take 5 mg by mouth once.   Multiple Vitamin (MULTIVITAMIN WITH MINERALS) TABS tablet Take 1 tablet by mouth daily.   omeprazole (PRILOSEC) 40 MG capsule Take 1 capsule (40 mg total) by mouth daily. Take one tablet about 30 minutes prior to  breakfast daily.   rosuvastatin (CRESTOR) 5 MG tablet Take 5 mg by mouth daily.   Past Medical History:  Diagnosis Date   Anxiety    Clotting disorder ([place])    thrombosis yeARS AGO - NO BLOOD THINNER   Complication of anesthesia    woken up before over   Cough    GERD (gastroesophageal reflux disease)    Hemorrhoids    external and internal   Hypertension    Nodule of lower lobe of left lung    Peripheral vascular disease ([facility])    acute right popliteal artery thrombosis, s/p mechanical thrombectomy and thrombolysis 06/16/07 in [place]; scheduled for surgical right popliteal artery thrombectomy 07/22/07   PFO with atrial septal aneurysm    07/17/07 vascular surgery notes by Dr. Sandrea [name], TEE done as part of acute right popliteal artery occlusion work-up showed no evidence of thrombus; howevever "there is evidence of a small patent foramen ovale with associated small aneuysmal finding"   Past Surgical History:  Procedure Laterality Date   BREAST SURGERY     reduction   CESAREAN SECTION     COLONOSCOPY  2006   Dr. Nyra [name] in [place]   [place] (VATS)/WEDGE RESECTION Left 06/07/2019   Procedure: VIDEO ASSISTED THORACOSCOPY (VATS)/WEDGE RESECTION;  Surgeon: Patricia Nakayama, MD;  Location: [facility] OR;  Service: Thoracic;  Laterality: Left;   WISDOM TOOTH EXTRACTION     x4   Social History   Social History Narrative   Patient is married and living in [place]   She has alcoholism and drinks 10 or more drinks a day\   There is also history of polysubstance abuse otherwise   She smokes cigarettes   family history includes Barrett's esophagus in her mother; Esophageal cancer in her maternal grandfather; Heart disease in her mother; Lung cancer in her mother.   Review of Systems As above  Objective:   Physical Exam BP 140/88   Pulse 96   Ht 5' (1.524 m)   Wt 157 lb (71.2 kg)   BMI 30.66  kg/m   Patricia [name], CMA present.   Rectal shows external hemorrhoids and tags  DRE - edema/stenosis in anal canel, nontender No mass  stool BSS 6  Ansoscopy - external hemorrhoids as seen above + Gr 1 internal hemorrhoids NL mucosa no fissure

## 2021-03-27 ENCOUNTER — Encounter: Payer: BC Managed Care – PPO | Admitting: Gastroenterology
